# Patient Record
Sex: Female | Born: 1955 | Race: White | Hispanic: No | Marital: Married | Smoking: Former smoker
Health system: Southern US, Community
[De-identification: ages and names within clinical notes are randomized; demographics above are authoritative.]

## PROBLEM LIST (undated history)

## (undated) DIAGNOSIS — I10 Essential (primary) hypertension: Secondary | ICD-10-CM

## (undated) DIAGNOSIS — K219 Gastro-esophageal reflux disease without esophagitis: Secondary | ICD-10-CM

## (undated) DIAGNOSIS — E7800 Pure hypercholesterolemia, unspecified: Secondary | ICD-10-CM

## (undated) DIAGNOSIS — I499 Cardiac arrhythmia, unspecified: Secondary | ICD-10-CM

## (undated) DIAGNOSIS — F32A Depression, unspecified: Secondary | ICD-10-CM

## (undated) DIAGNOSIS — M1990 Unspecified osteoarthritis, unspecified site: Secondary | ICD-10-CM

## (undated) DIAGNOSIS — F329 Major depressive disorder, single episode, unspecified: Secondary | ICD-10-CM

## (undated) HISTORY — DX: Unspecified osteoarthritis, unspecified site: M19.90

## (undated) HISTORY — PX: OTHER SURGICAL HISTORY: SHX169

## (undated) HISTORY — DX: Essential (primary) hypertension: I10

## (undated) HISTORY — PX: PILONIDAL CYST EXCISION: SHX744

## (undated) HISTORY — PX: CHOLECYSTECTOMY OPEN: SUR202

## (undated) HISTORY — DX: Gastro-esophageal reflux disease without esophagitis: K21.9

## (undated) HISTORY — DX: Cardiac arrhythmia, unspecified: I49.9

## (undated) HISTORY — DX: Depression, unspecified: F32.A

## (undated) HISTORY — DX: Major depressive disorder, single episode, unspecified: F32.9

## (undated) HISTORY — DX: Pure hypercholesterolemia, unspecified: E78.00

---
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
## 1984-03-11 HISTORY — PX: TONSILLECTOMY: SUR1361

## 2004-11-27 ENCOUNTER — Ambulatory Visit: Payer: Self-pay | Admitting: Internal Medicine

## 2004-12-07 ENCOUNTER — Ambulatory Visit (HOSPITAL_COMMUNITY): Admission: RE | Admit: 2004-12-07 | Discharge: 2004-12-07 | Payer: Self-pay | Admitting: Internal Medicine

## 2004-12-07 ENCOUNTER — Ambulatory Visit: Payer: Self-pay | Admitting: Internal Medicine

## 2011-03-14 ENCOUNTER — Encounter (INDEPENDENT_AMBULATORY_CARE_PROVIDER_SITE_OTHER): Payer: Self-pay | Admitting: General Surgery

## 2011-03-15 ENCOUNTER — Ambulatory Visit (INDEPENDENT_AMBULATORY_CARE_PROVIDER_SITE_OTHER): Payer: 59 | Admitting: General Surgery

## 2011-03-15 ENCOUNTER — Encounter (INDEPENDENT_AMBULATORY_CARE_PROVIDER_SITE_OTHER): Payer: Self-pay | Admitting: General Surgery

## 2011-03-15 VITALS — BP 152/96 | HR 72 | Temp 97.4°F | Resp 16 | Ht 66.5 in | Wt 263.4 lb

## 2011-03-15 DIAGNOSIS — E66813 Obesity, class 3: Secondary | ICD-10-CM | POA: Insufficient documentation

## 2011-03-15 DIAGNOSIS — Z1231 Encounter for screening mammogram for malignant neoplasm of breast: Secondary | ICD-10-CM

## 2011-03-15 NOTE — Progress Notes (Signed)
Patient ID: Melanie Bray, female   DOB: January 24, 1956, 56 y.o.   MRN: [id number]  Chief Complaint  Patient presents with  . Other    new bariatric- RNY    HPI Melanie Bray is a 56 y.o. female.   HPI 56 year old morbidly obese Caucasian female referred by Dr Graciela Husbands for evaluation for weight loss surgery. The patient is specifically interested in gastric bypass surgery. She states that she struggled all her life with her weight. She's tried many different things for sustained weight loss however all have been unsuccessful. She has tried hypnosis, Weight Watchers, Nutrisystem, Doylene Bode, Slim fast [employer], and [employer] all without any long-term success.  Her comorbidities include a cardiac arrhythmia, hypertension, high cholesterol, borderline diabetes, gastroesophageal reflux disease, and depression, joint pain in her knees.  She is currently smoking. She smokes about 10-15 cigarettes a day. She has stopped smoking in the past.    Past Medical History  Diagnosis Date  . Hypertension   . Hypercholesteremia   . Depression   . GERD (gastroesophageal reflux disease)   . Arthritis   . Asthma   . Arrhythmia     Past Surgical History  Procedure Date  . Cholecystectomy open 1980s  . Tonsillectomy 1986  . Oophorectomy 1976  . Pilonidal cyst excision   . Tubal ligation     right tube    Family History  Problem Relation Age of Onset  . Cancer Mother     lung  . Heart disease Maternal Aunt   . Heart disease Maternal Uncle     Social History History  Substance Use Topics  . Smoking status: Current Everyday Smoker -- 0.7 packs/day  . Smokeless tobacco: Not on file  . Alcohol Use: No    No Known Allergies  Current Outpatient Prescriptions  Medication Sig Dispense Refill  . cetirizine (ZYRTEC) 10 MG tablet Take 10 mg by mouth daily.        . flecainide (TAMBOCOR) 100 MG tablet Take 100 mg by mouth 2 (two) times daily.        . metoprolol (LOPRESSOR) 100 MG  tablet Take 100 mg by mouth 2 (two) times daily.        . simvastatin (ZOCOR) 80 MG tablet Take 80 mg by mouth at bedtime.          Review of Systems Review of Systems  Constitutional: Negative for fever, activity change, appetite change and unexpected weight change.  HENT: Negative for hearing loss, ear pain, facial swelling and sneezing.   Eyes: Negative for photophobia, discharge and visual disturbance.  Respiratory: Negative for apnea, cough, chest tightness and shortness of breath.        +seasonal asthma; prn inhaler; +DOE with 1 flight. Denies PND, orthopnea, CP  Cardiovascular: Negative for chest pain, palpitations and leg swelling.       Takes med for heart arrhythmia. Has had a stress test and cardiac cath in the past. Her cardiologist is Dr Graciela Husbands in [place].  Gastrointestinal: Negative for abdominal pain, diarrhea, constipation, blood in stool and rectal pain.       +heartburn. Takes OTC reflux med  Genitourinary: Negative for dysuria, frequency, hematuria and vaginal bleeding.  Musculoskeletal: Negative for joint swelling.       L hand pain was told secondary to bulge disc in neck  Skin: Negative for color change and pallor.  Neurological: Negative for dizziness, tremors, syncope, light-headedness and headaches.  Hematological: Negative.   Psychiatric/Behavioral: Negative.  Blood pressure 152/96, pulse 72, temperature 97.4 F (36.3 C), temperature source Temporal, resp. rate 16, height 5' 6.5" (1.689 m), weight 263 lb 6.4 oz (119.477 kg).  Physical Exam Physical Exam  Vitals reviewed. Constitutional: She is oriented to person, place, and time. She appears well-developed and well-nourished. No distress.  HENT:  Head: Normocephalic and atraumatic.  Right Ear: External ear normal.  Left Ear: External ear normal.  Nose: Nose normal.  Eyes: Conjunctivae are normal. No scleral icterus.  Neck: Normal range of motion. Neck supple. No JVD present. No tracheal deviation  present. No thyromegaly present.  Cardiovascular: Normal rate, regular rhythm, normal heart sounds and intact distal pulses.   Pulmonary/Chest: Effort normal and breath sounds normal. No respiratory distress. She has no wheezes.  Abdominal: Soft. Bowel sounds are normal. She exhibits no distension. There is no tenderness.         Old subcostal and lower midline incision  Musculoskeletal: Normal range of motion. She exhibits no edema and no tenderness.  Lymphadenopathy:    She has no cervical adenopathy.  Neurological: She is alert and oriented to person, place, and time. She exhibits normal muscle tone.  Skin: Skin is warm and dry. No rash noted. She is not diaphoretic. No erythema.  Psychiatric: She has a normal mood and affect. Her behavior is normal. Judgment and thought content normal.    Data Reviewed Pt's personal letter & self reported diet history  Data Requested Dr Odessa Fleming notes including office note, stress test, and cardiac catherization results  Assessment    Morbid obesity BMI 42 Cardiac arrhythmia Hypertension Dyslipidemia GERD Joint Pain    Plan    We discussed laparoscopic Roux-en-Y gastric bypass. The patient was given educational material. I quoted the patient that they can expect to lose 50-70% of their excess weight with the gastric bypass. We did discuss the possibility of weight regain several years after the procedure.  I also explained to the patient that I would not perform a gastric bypass as long as she continues to smoke.  We discussed the increased risks of complications of surgery with smoking. We also discussed the increased incidence of stomal ulcers with smoking.   We discussed the risk and benefits of surgery including but not limited to anesthesia risk, bleeding, infection, blood clot formation, anastomotic leak, anastomotic stricture, ulcer formation, death, respiratory complications, intestinal blockage, internal hernia, vitamin and  nutritional deficiencies, injury to surrounding structures, failure to lose weight and mood changes.  We discussed that before and after surgery that there would be an alteration in their diet. I explained that we have put them on a diet 2 weeks before surgery. I also explained that they would be on a liquid diet for 2 weeks after surgery. We discussed that they would have to avoid certain foods such as sugar after surgery. We discussed the importance of physical activity as well as compliance with our dietary and supplement recommendations.  We will proceed with her evaluation for weight loss surgery. In the interim, she will increase her exercise activity and stop smoking.  We will check a urine nicotine level prior to submitting her claim to her insurance provider. She was informed of the impending urine nicotine test.  Mary Sella. Andrey Campanile, MD, FACS General, Bariatric, & Minimally Invasive Surgery [facility], [place]         [facility] M 03/15/2011, 11:58 AM

## 2011-03-15 NOTE — Patient Instructions (Addendum)
Stop smoking  We will start your weight loss surgery evaluation with labs, upper gi, chest xray, mammogram, cardiology clearance, nutrition and psychological evaluation.

## 2011-03-26 ENCOUNTER — Encounter: Payer: Self-pay | Admitting: *Deleted

## 2011-03-26 ENCOUNTER — Encounter: Payer: 59 | Attending: General Surgery | Admitting: *Deleted

## 2011-03-26 DIAGNOSIS — Z713 Dietary counseling and surveillance: Secondary | ICD-10-CM | POA: Insufficient documentation

## 2011-03-26 DIAGNOSIS — Z01818 Encounter for other preprocedural examination: Secondary | ICD-10-CM | POA: Insufficient documentation

## 2011-03-26 NOTE — Progress Notes (Signed)
  Pre-Op Assessment Visit: Pre-Operative Gastric Bypass Surgery  Medical Nutrition Therapy:  Appt start time: 1200 end time:  1300.  Patient was seen on 03/26/2011 for Pre-Operative Gastric Bypass Nutrition Assessment. Assessment and letter of approval faxed to [facility] Bariatric Surgery Program coordinator on 03/26/2011.  Approval letter sent to [facility] center and will be available in the chart under the media tab.  Handouts given during visit include:  Pre-Op Goals Handout  Patient to call for Pre-Op and Post-Op Nutrition Education at [facility] when surgery is scheduled.

## 2011-03-26 NOTE — Patient Instructions (Signed)
   Follow Pre-Op Nutrition Goals to prepare for Gastric Bypass Surgery.   Call the [facility] at [phone] once you have been given your surgery date to enrolled in the Pre-Op Nutrition Class. You will need to attend this nutrition class 3-4 weeks prior to your surgery. 

## 2011-04-02 ENCOUNTER — Encounter (HOSPITAL_COMMUNITY)
Admission: RE | Disposition: A | Discharge status: Home / Self Care | Payer: Self-pay | Source: Ambulatory Visit | Attending: General Surgery

## 2011-04-02 ENCOUNTER — Encounter (HOSPITAL_COMMUNITY): Payer: Self-pay

## 2011-04-02 ENCOUNTER — Ambulatory Visit (HOSPITAL_COMMUNITY)
Admission: RE | Admit: 2011-04-02 | Discharge: 2011-04-02 | Disposition: A | Discharge status: Home / Self Care | Payer: 59 | Source: Ambulatory Visit | Attending: General Surgery | Admitting: General Surgery

## 2011-04-02 DIAGNOSIS — Z01818 Encounter for other preprocedural examination: Secondary | ICD-10-CM | POA: Insufficient documentation

## 2011-04-02 HISTORY — PX: BREATH TEK H PYLORI: SHX5422

## 2011-04-02 SURGERY — BREATH TEST, FOR HELICOBACTER PYLORI

## 2011-04-03 ENCOUNTER — Encounter (HOSPITAL_COMMUNITY): Payer: Self-pay

## 2011-04-03 ENCOUNTER — Encounter (HOSPITAL_COMMUNITY): Payer: Self-pay | Admitting: General Surgery

## 2011-04-17 ENCOUNTER — Ambulatory Visit (HOSPITAL_COMMUNITY): Payer: Self-pay

## 2011-04-17 ENCOUNTER — Other Ambulatory Visit (HOSPITAL_COMMUNITY): Payer: Self-pay

## 2011-04-17 ENCOUNTER — Inpatient Hospital Stay (HOSPITAL_COMMUNITY): Admission: RE | Admit: 2011-04-17 | Payer: Self-pay | Source: Ambulatory Visit

## 2011-04-25 ENCOUNTER — Ambulatory Visit (HOSPITAL_COMMUNITY)
Admission: RE | Admit: 2011-04-25 | Discharge: 2011-04-25 | Disposition: A | Discharge status: Home / Self Care | Payer: 59 | Source: Ambulatory Visit | Attending: General Surgery | Admitting: General Surgery

## 2011-04-25 ENCOUNTER — Other Ambulatory Visit (INDEPENDENT_AMBULATORY_CARE_PROVIDER_SITE_OTHER): Payer: Self-pay | Admitting: General Surgery

## 2011-04-25 ENCOUNTER — Ambulatory Visit (HOSPITAL_COMMUNITY): Admission: RE | Admit: 2011-04-25 | Payer: 59 | Source: Ambulatory Visit

## 2011-04-25 DIAGNOSIS — K219 Gastro-esophageal reflux disease without esophagitis: Secondary | ICD-10-CM | POA: Insufficient documentation

## 2011-04-25 DIAGNOSIS — Z1231 Encounter for screening mammogram for malignant neoplasm of breast: Secondary | ICD-10-CM

## 2011-04-25 DIAGNOSIS — Z6841 Body Mass Index (BMI) 40.0 and over, adult: Secondary | ICD-10-CM | POA: Insufficient documentation

## 2011-04-25 DIAGNOSIS — E7800 Pure hypercholesterolemia, unspecified: Secondary | ICD-10-CM | POA: Insufficient documentation

## 2011-04-25 DIAGNOSIS — E66813 Obesity, class 3: Secondary | ICD-10-CM

## 2011-04-25 DIAGNOSIS — I10 Essential (primary) hypertension: Secondary | ICD-10-CM | POA: Insufficient documentation

## 2011-04-26 LAB — LIPID PANEL
Cholesterol: 136 mg/dL (ref 0–200)
HDL: 41 mg/dL (ref >39)
Total CHOL/HDL Ratio: 3.3 Ratio
VLDL: 19 mg/dL (ref 0–40)

## 2011-04-26 LAB — CBC WITH DIFFERENTIAL/PLATELET
Basophils Relative: 0 % (ref 0–1)
Eosinophils Absolute: 0.2 10*3/uL (ref 0.0–0.7)
Eosinophils Relative: 4 % (ref 0–5)
MCH: 29.9 pg (ref 26.0–34.0)
MCHC: 33.1 g/dL (ref 30.0–36.0)
MCV: 90.5 fL (ref 78.0–100.0)
Neutrophils Relative %: 50 % (ref 43–77)
Platelets: 260 10*3/uL (ref 150–400)
RDW: 13 % (ref 11.5–15.5)

## 2011-04-26 LAB — COMPREHENSIVE METABOLIC PANEL
ALT: 19 U/L (ref 0–35)
Alkaline Phosphatase: 94 U/L (ref 39–117)
Creat: 0.91 mg/dL (ref 0.50–1.10)
Sodium: 139 mEq/L (ref 135–145)
Total Bilirubin: 0.4 mg/dL (ref 0.3–1.2)
Total Protein: 7 g/dL (ref 6.0–8.3)

## 2011-04-26 LAB — TSH: TSH: 2.334 u[IU]/mL (ref 0.350–4.500)

## 2011-04-30 ENCOUNTER — Other Ambulatory Visit (INDEPENDENT_AMBULATORY_CARE_PROVIDER_SITE_OTHER): Payer: Self-pay | Admitting: General Surgery

## 2011-04-30 DIAGNOSIS — R928 Other abnormal and inconclusive findings on diagnostic imaging of breast: Secondary | ICD-10-CM

## 2011-05-03 ENCOUNTER — Encounter (INDEPENDENT_AMBULATORY_CARE_PROVIDER_SITE_OTHER): Payer: Self-pay | Admitting: General Surgery

## 2011-05-08 ENCOUNTER — Ambulatory Visit
Admission: RE | Admit: 2011-05-08 | Discharge: 2011-05-08 | Disposition: A | Discharge status: Home / Self Care | Payer: 59 | Source: Ambulatory Visit | Attending: General Surgery | Admitting: General Surgery

## 2011-05-08 DIAGNOSIS — R928 Other abnormal and inconclusive findings on diagnostic imaging of breast: Secondary | ICD-10-CM

## 2011-05-16 ENCOUNTER — Telehealth (INDEPENDENT_AMBULATORY_CARE_PROVIDER_SITE_OTHER): Payer: Self-pay | Admitting: General Surgery

## 2011-05-16 DIAGNOSIS — Z720 Tobacco use: Secondary | ICD-10-CM

## 2011-05-16 NOTE — Telephone Encounter (Addendum)
Called patient and left message on machine for her to call me back. Patient needs a urine nicotine ordered for evaluation prior to [place] performing bariatric surgery. Order placed. Awaiting return call from patient.

## 2011-05-16 NOTE — Telephone Encounter (Signed)
Message copied by Liliana Cline on Thu May 16, 2011  8:46 AM ------      Message from: Leandrew Koyanagi      Created: Wed May 15, 2011  4:20 PM       Lesly Rubenstein,            I sent this patient's information in for insurance approval on Friday. Please order the nicotine test that Dr Andrey Campanile wanted.            Thanks,            Okey Regal                  ----- Message -----         From: Liliana Cline, CMA         Sent: 04/10/2011  10:20 AM           To: Leandrew Koyanagi            I had made this reminder for myself, but I am not really going to know when everything is ready for this patient to send off for insurance approval, but she is supposed to stop smoking. If you will let me know when she is to that point I will order a urine nicotine on her. Thanks.             Brian Zeitlin            ----- Message -----         From: Liliana Cline, CMA         Sent: 03/15/2011  11:43 AM           To: Liliana Cline, CMA            Need urine nicotine prior to sending off for insurance approval

## 2011-05-17 ENCOUNTER — Telehealth (INDEPENDENT_AMBULATORY_CARE_PROVIDER_SITE_OTHER): Payer: Self-pay

## 2011-05-17 NOTE — Telephone Encounter (Signed)
Pt returned call to [facility]. Pt advised of need for urine test prior to surgery. Pt states she is working on stop smoking but still smokes some. Pt states she will to to lab once she has been off of cigarettes for a few days.

## 2011-05-24 ENCOUNTER — Telehealth (INDEPENDENT_AMBULATORY_CARE_PROVIDER_SITE_OTHER): Payer: Self-pay | Admitting: General Surgery

## 2011-05-24 ENCOUNTER — Other Ambulatory Visit (INDEPENDENT_AMBULATORY_CARE_PROVIDER_SITE_OTHER): Payer: Self-pay | Admitting: General Surgery

## 2011-05-24 NOTE — Telephone Encounter (Signed)
Pt calling in asking where to go for the urine nicotine test.  Please call her with details:  [phone]

## 2011-05-24 NOTE — Telephone Encounter (Signed)
Spoke with patient and made her aware of where to have done. She will go today to have urine nicotine done. Order faxed to both numbers for the lab.

## 2011-05-29 ENCOUNTER — Telehealth (INDEPENDENT_AMBULATORY_CARE_PROVIDER_SITE_OTHER): Payer: Self-pay | Admitting: General Surgery

## 2011-05-29 NOTE — Telephone Encounter (Signed)
Patient made aware nicotine test was negative. Melanie Bray will call her to get everything set up.

## 2011-06-20 ENCOUNTER — Encounter (INDEPENDENT_AMBULATORY_CARE_PROVIDER_SITE_OTHER): Payer: Self-pay | Admitting: General Surgery

## 2011-06-20 DIAGNOSIS — N6002 Solitary cyst of left breast: Secondary | ICD-10-CM | POA: Insufficient documentation

## 2011-06-27 ENCOUNTER — Encounter: Payer: 59 | Attending: General Surgery | Admitting: *Deleted

## 2011-06-27 DIAGNOSIS — Z713 Dietary counseling and surveillance: Secondary | ICD-10-CM | POA: Insufficient documentation

## 2011-06-27 DIAGNOSIS — Z01818 Encounter for other preprocedural examination: Secondary | ICD-10-CM | POA: Insufficient documentation

## 2011-06-30 NOTE — Patient Instructions (Signed)
Follow:   Pre-Op Diet per MD 2 weeks prior to surgery  Phase 2- Liquids (clear/full) 2 weeks after surgery  Vitamin/Mineral/Calcium guidelines for purchasing bariatric supplements  Exercise guidelines pre and post-op per MD  Follow-up at [facility] in 2 weeks post-op for diet advancement. Contact Brianny Soulliere as needed with questions/concerns. 

## 2011-06-30 NOTE — Progress Notes (Signed)
  Bariatric Class:  Appt start time: 0830 end time:  0930.  Pre-Operative Nutrition Class  Patient was seen on 06/27/2011 for Pre-Operative Bariatric Surgery Education at the [facility].  Surgery date: 07/29/11 Surgery type: RYGB  Last weight @ [facility]: 265.4 lbs (03/26/11)  Samples given per MNT protocol: Bariatric Advantage Multivitamin Lot # [id number] Exp: 09/13  Bariatric Advantage Calcium Citrate Lot # [id number] Exp: 09/13  Bariatric Advantage B-12 dots Lot # [id number] Exp: 05/13  Celebrate Vitamins Multivitamin Lot # [id number] Exp: 06/14  Celebrate Vitamins Calcium Citrate Lot # [id number] Exp: 07/13  Celebrate Vitamins B-12 dots Lot # [id number] Exp: 07/14  Corliss Marcus  Lot # [id number] Exp: 06/4  The following the learning objective met by the patient during this course:   Identifies Pre-Op Dietary Goals and will begin 2 weeks pre-operatively   Identifies appropriate sources of fluids and proteins   States protein recommendations and appropriate sources pre and post-operatively  Identifies Post-Operative Dietary Goals and will follow for 2 weeks post-operatively  Identifies appropriate multivitamin and calcium sources  Describes the need for physical activity post-operatively and will follow MD recommendations  States when to call healthcare provider regarding medication questions or post-operative complications  Handouts given during class include:  Pre-Op Bariatric Surgery Diet Handout  Protein Shake Handout  Post-Op Bariatric Surgery Nutrition Handout  BELT Program Information Flyer  Support Group Information Flyer  Follow-Up Plan: Patient will follow-up at [facility] 2 weeks post operatively for diet advancement per MD.

## 2011-07-12 ENCOUNTER — Encounter (INDEPENDENT_AMBULATORY_CARE_PROVIDER_SITE_OTHER): Payer: Self-pay | Admitting: General Surgery

## 2011-07-12 ENCOUNTER — Ambulatory Visit (INDEPENDENT_AMBULATORY_CARE_PROVIDER_SITE_OTHER): Payer: 59 | Admitting: General Surgery

## 2011-07-12 VITALS — BP 162/90 | HR 76 | Temp 98.1°F | Resp 16 | Ht 66.0 in | Wt 262.8 lb

## 2011-07-12 MED ORDER — PEG 3350-KCL-NABCB-NACL-NASULF 236 G PO SOLR: 4.0000 L | Freq: Once | ORAL | Status: AC

## 2011-07-12 NOTE — Progress Notes (Signed)
Patient ID: Melanie Bray, female   DOB: 1955/10/19, 56 y.o.   MRN: [id number]  Chief Complaint  Patient presents with  . Bariatric Pre-op    RNY    HPI Melanie Bray is a 56 y.o. female.   HPI [name] comes in to discuss her upcoming Roux-en-Y gastric bypass surgery. Today as her preoperative appointment. I initially saw her in January 2013. At her first appointment, she was smoking. Since that time she has stopped smoking. A urine nicotine test in March showed no evidence of nicotine. She has completed her preoperative workup and is scheduled for surgery. Her cardiologist has cleared her for surgery.  Past Medical History  Diagnosis Date  . Hypertension   . Hypercholesteremia   . Depression   . GERD (gastroesophageal reflux disease)   . Arthritis   . Asthma   . Arrhythmia     Past Surgical History  Procedure Date  . Cholecystectomy open 1980s  . Tonsillectomy 1986  . Oophorectomy 1976  . Pilonidal cyst excision   . Tubal ligation     right tube  . Breath tek h pylori 04/02/2011    Procedure: BREATH TEK H PYLORI;  Surgeon: Atilano Ina, MD;  Location: [facility];  Service: General;  Laterality: N/A;    Family History  Problem Relation Age of Onset  . Cancer Mother     lung  . Heart disease Maternal Aunt   . Heart disease Maternal Uncle     Social History History  Substance Use Topics  . Smoking status: Former Smoker -- 0.7 packs/day    Quit date: 05/10/2011  . Smokeless tobacco: Not on file  . Alcohol Use: No    No Known Allergies  Current Outpatient Prescriptions  Medication Sig Dispense Refill  . cetirizine (ZYRTEC) 10 MG tablet Take 10 mg by mouth daily.        . flecainide (TAMBOCOR) 100 MG tablet Take 100 mg by mouth 2 (two) times daily.        . metoprolol (LOPRESSOR) 100 MG tablet Take 100 mg by mouth 2 (two) times daily.        . simvastatin (ZOCOR) 80 MG tablet Take 80 mg by mouth at bedtime.        . polyethylene glycol  (GOLYTELY) 236 G solution Take 4,000 mLs by mouth once.  4000 mL  0    Review of Systems Review of Systems  Constitutional: Negative for chills, activity change, appetite change and unexpected weight change.  HENT: Negative for neck pain and ear discharge.   Eyes: Negative for photophobia and visual disturbance.  Respiratory: Negative for apnea, chest tightness and shortness of breath.        DOE with 1 flight; able to ambulate in store without SOB  Cardiovascular: Negative for chest pain.       Occasional fast heart beat. No cp, sob, orthopnea, PND, leg swelling  Gastrointestinal: Negative for abdominal pain and abdominal distention.       Some heartburn; takes OTC reflux prn  Genitourinary: Negative for dysuria and difficulty urinating.  Neurological: Negative for seizures, facial asymmetry, speech difficulty, numbness and headaches.  Hematological: Negative for adenopathy.  Psychiatric/Behavioral: Negative for behavioral problems.    Blood pressure 162/90, pulse 76, temperature 98.1 F (36.7 C), temperature source Temporal, resp. rate 16, height 5\' 6"  (1.676 m), weight 262 lb 12.8 oz (119.205 kg).  Physical Exam Physical Exam  Vitals reviewed. Constitutional: She is oriented to person, place,  and time. She appears well-developed and well-nourished. No distress.       obese  HENT:  Head: Normocephalic and atraumatic.  Right Ear: External ear normal.  Left Ear: External ear normal.  Eyes: Conjunctivae are normal. No scleral icterus.  Neck: Normal range of motion. Neck supple. No tracheal deviation present. No thyromegaly present.  Cardiovascular: Normal rate, regular rhythm, normal heart sounds and intact distal pulses.   No murmur heard. Pulmonary/Chest: Effort normal and breath sounds normal. No respiratory distress. She has no wheezes. She has no rales.  Abdominal: Soft. Bowel sounds are normal. She exhibits no distension. There is no tenderness.    Musculoskeletal: She  exhibits no edema and no tenderness.  Lymphadenopathy:    She has no cervical adenopathy.  Neurological: She is alert and oriented to person, place, and time. She exhibits normal muscle tone.  Skin: Skin is warm and dry. No rash noted. She is not diaphoretic. No erythema.  Psychiatric: She has a normal mood and affect. Her behavior is normal. Judgment and thought content normal.    Data Reviewed My last note UGI - small hiatal hernia Labs from 04/2011 Cardiac clearance note from Dr. Graciela Husbands in [place] on 06/12/11 - recommended telemetry, taking Toprol XL and tambocor on day of surgery  Assessment    Morbid obesity BMI 42.2 Hypertension Hypercholesterolemia Arthritis GERD - nonmedicated H/o SVT     Plan    We reviewed her preoperative evaluation including her upper GI, , labs, and clearance note from her cardiologist. I congratulated her on her smoking cessation. Her urine was tested for nicotine in March which was negative. We rediscussed the importance of smoking cessation for gastric bypass patients.  We rediscussed gastric bypass surgery and the typical postoperative course. We also briefly rediscussed the risks of surgery. I explained to her that her surgery may take a little bit longer than the typical patient surgery because of her prior abdominal surgery. She was encouraged to complete her bowel prep the day before surgery.  She is currently scheduled for laparoscopic Roux-en-Y gastric bypass with EGD on Monday, May 20.  She was encouraged to be compliant with her preoperative diet.  Mary Sella. Andrey Campanile, MD, FACS General, Bariatric, & Minimally Invasive Surgery [facility], [place]        [facility] M 07/12/2011, 4:12 PM

## 2011-07-16 ENCOUNTER — Encounter (HOSPITAL_COMMUNITY): Payer: Self-pay | Admitting: Pharmacy Technician

## 2011-07-23 NOTE — Pre-Procedure Instructions (Signed)
Need pre op orders pt coming for pre op 07-24-11 215pm. thanks

## 2011-07-24 ENCOUNTER — Telehealth (INDEPENDENT_AMBULATORY_CARE_PROVIDER_SITE_OTHER): Payer: Self-pay | Admitting: General Surgery

## 2011-07-24 ENCOUNTER — Encounter (HOSPITAL_COMMUNITY): Payer: Self-pay

## 2011-07-24 ENCOUNTER — Encounter (HOSPITAL_COMMUNITY)
Admission: RE | Admit: 2011-07-24 | Discharge: 2011-07-24 | Disposition: A | Discharge status: Home / Self Care | Payer: 59 | Source: Ambulatory Visit | Attending: General Surgery | Admitting: General Surgery

## 2011-07-24 LAB — CBC
HCT: 46.3 % — ABNORMAL HIGH (ref 36.0–46.0)
Hemoglobin: 15.6 g/dL — ABNORMAL HIGH (ref 12.0–15.0)
MCH: 29.8 pg (ref 26.0–34.0)
MCV: 88.5 fL (ref 78.0–100.0)
RBC: 5.23 MIL/uL — ABNORMAL HIGH (ref 3.87–5.11)

## 2011-07-24 LAB — COMPREHENSIVE METABOLIC PANEL
ALT: 24 U/L (ref 0–35)
BUN: 19 mg/dL (ref 6–23)
CO2: 27 mEq/L (ref 19–32)
Calcium: 9.4 mg/dL (ref 8.4–10.5)
GFR calc Af Amer: 89 mL/min — ABNORMAL LOW (ref >90)
GFR calc non Af Amer: 77 mL/min — ABNORMAL LOW (ref >90)
Glucose, Bld: 101 mg/dL — ABNORMAL HIGH (ref 70–99)
Sodium: 139 mEq/L (ref 135–145)

## 2011-07-24 NOTE — Pre-Procedure Instructions (Addendum)
Cardiac clearance note and ekg dr Graciela Husbands 06-12-2011 on chart Chest 2 view xray 04-25-2011 epic

## 2011-07-24 NOTE — Telephone Encounter (Signed)
Sharon from [facility] preop called. Need orders in [facility] for surgery on patient. They will order labs per anesthesia. Please put orders in system. Thanks.

## 2011-07-24 NOTE — Patient Instructions (Addendum)
20 ANGELYNA HENDERSON  07/24/2011   Your procedure is scheduled on:  07-29-2011 Monday  Report to [facility] at 0530  AM.  Call this number if you have problems the morning of surgery:[phone]   Remember:   Do not eat food or drink liquids:After Midnight.  .  Take these medicines the morning of surgery with A SIP OF WATER: zyrtec, flecaninide, metoprolol, simvastatin.   Do not wear jewelry or make up.  Do not wear lotions, powders, or perfumes.Do not wear deodorant.    Do not bring valuables to the hospital.  Contacts, dentures or bridgework may not be worn into surgery.  Leave suitcase in the car. After surgery it may be brought to your room.  For patients admitted to the hospital, checkout time is 11:00 AM the day of discharge.     Special Instructions: CHG Shower Use Special Wash: 1/2 bottle night before surgery and 1/2 bottle morning of surgery.neck down avoid private area, no shaving for 2 days before showers.   Please read over the following fact sheets that you were given: MRSA Information  [facility] pre op nurse phone number [phone], call if needed

## 2011-07-24 NOTE — Pre-Procedure Instructions (Addendum)
Spoke with sara glaspey rn, dr Andrey Campanile out of town until 07-29-2011 will make dr Andrey Campanile aware no orders in epic for 07-29-11 surgery, labs will be done per anesthesia with pst visit 07-24-2011.

## 2011-07-29 ENCOUNTER — Encounter (HOSPITAL_COMMUNITY): Payer: Self-pay | Admitting: Anesthesiology

## 2011-07-29 ENCOUNTER — Inpatient Hospital Stay (HOSPITAL_COMMUNITY)
Admission: RE | Admit: 2011-07-29 | Discharge: 2011-07-31 | DRG: 621 | Disposition: A | Discharge status: Home / Self Care | Payer: 59 | Source: Ambulatory Visit | Attending: General Surgery | Admitting: General Surgery

## 2011-07-29 ENCOUNTER — Ambulatory Visit (HOSPITAL_COMMUNITY): Payer: 59 | Admitting: Anesthesiology

## 2011-07-29 ENCOUNTER — Encounter (HOSPITAL_COMMUNITY)
Admission: RE | Disposition: A | Discharge status: Home / Self Care | Payer: Self-pay | Source: Ambulatory Visit | Attending: General Surgery

## 2011-07-29 ENCOUNTER — Encounter (HOSPITAL_COMMUNITY): Payer: Self-pay

## 2011-07-29 DIAGNOSIS — Z01812 Encounter for preprocedural laboratory examination: Secondary | ICD-10-CM

## 2011-07-29 DIAGNOSIS — K219 Gastro-esophageal reflux disease without esophagitis: Secondary | ICD-10-CM | POA: Diagnosis present

## 2011-07-29 DIAGNOSIS — I10 Essential (primary) hypertension: Secondary | ICD-10-CM

## 2011-07-29 DIAGNOSIS — Z6841 Body Mass Index (BMI) 40.0 and over, adult: Secondary | ICD-10-CM

## 2011-07-29 DIAGNOSIS — K210 Gastro-esophageal reflux disease with esophagitis: Secondary | ICD-10-CM

## 2011-07-29 DIAGNOSIS — M129 Arthropathy, unspecified: Secondary | ICD-10-CM | POA: Diagnosis present

## 2011-07-29 DIAGNOSIS — E66813 Obesity, class 3: Secondary | ICD-10-CM

## 2011-07-29 DIAGNOSIS — E7800 Pure hypercholesterolemia, unspecified: Secondary | ICD-10-CM | POA: Diagnosis present

## 2011-07-29 DIAGNOSIS — J45909 Unspecified asthma, uncomplicated: Secondary | ICD-10-CM | POA: Diagnosis present

## 2011-07-29 HISTORY — PX: GASTRIC ROUX-EN-Y: SHX5262

## 2011-07-29 LAB — TYPE AND SCREEN: ABO/RH(D): B POS

## 2011-07-29 SURGERY — LAPAROSCOPIC ROUX-EN-Y GASTRIC BYPASS WITH UPPER ENDOSCOPY
Anesthesia: General | Site: Abdomen | Wound class: Clean Contaminated

## 2011-07-29 MED ORDER — NEOSTIGMINE METHYLSULFATE 1 MG/ML IJ SOLN
INTRAMUSCULAR | Status: DC | PRN
  Administered 2011-07-29: 4 mg via INTRAVENOUS

## 2011-07-29 MED ORDER — UNJURY CHICKEN SOUP POWDER
2.0000 [oz_av] | Freq: Four times a day (QID) | ORAL | Status: DC
  Filled 2011-07-29 (×4): qty 27

## 2011-07-29 MED ORDER — BUPIVACAINE-EPINEPHRINE 0.25% -1:200000 IJ SOLN
INTRAMUSCULAR | Status: AC
  Filled 2011-07-29: qty 1

## 2011-07-29 MED ORDER — 0.9 % SODIUM CHLORIDE (POUR BTL) OPTIME
TOPICAL | Status: DC | PRN
  Administered 2011-07-29: 1000 mL

## 2011-07-29 MED ORDER — TISSEEL VH 10 ML EX KIT
PACK | CUTANEOUS | Status: DC | PRN
  Administered 2011-07-29: 10 mL

## 2011-07-29 MED ORDER — CIPROFLOXACIN IN D5W 400 MG/200ML IV SOLN
INTRAVENOUS | Status: DC | PRN
  Administered 2011-07-29: 400 mg via INTRAVENOUS

## 2011-07-29 MED ORDER — UNJURY VANILLA POWDER
2.0000 [oz_av] | Freq: Four times a day (QID) | ORAL | Status: DC
  Filled 2011-07-29 (×4): qty 27

## 2011-07-29 MED ORDER — LACTATED RINGERS IV SOLN
INTRAVENOUS | Status: DC | PRN
  Administered 2011-07-29 (×3): via INTRAVENOUS

## 2011-07-29 MED ORDER — ONDANSETRON HCL 4 MG/2ML IJ SOLN
4.0000 mg | INTRAMUSCULAR | Status: DC | PRN
  Administered 2011-07-29: 4 mg via INTRAVENOUS
  Filled 2011-07-29: qty 2

## 2011-07-29 MED ORDER — PROPOFOL 10 MG/ML IV EMUL
INTRAVENOUS | Status: DC | PRN
  Administered 2011-07-29: 200 mg via INTRAVENOUS

## 2011-07-29 MED ORDER — ACETAMINOPHEN 10 MG/ML IV SOLN
1000.0000 mg | Freq: Four times a day (QID) | INTRAVENOUS | Status: AC
  Administered 2011-07-29 – 2011-07-30 (×4): 1000 mg via INTRAVENOUS
  Filled 2011-07-29 (×4): qty 100

## 2011-07-29 MED ORDER — KCL IN DEXTROSE-NACL 20-5-0.45 MEQ/L-%-% IV SOLN
INTRAVENOUS | Status: AC
  Filled 2011-07-29: qty 1000

## 2011-07-29 MED ORDER — STERILE WATER FOR IRRIGATION IR SOLN
Status: DC | PRN
  Administered 2011-07-29: 1500 mL

## 2011-07-29 MED ORDER — ACETAMINOPHEN 10 MG/ML IV SOLN
INTRAVENOUS | Status: AC
  Filled 2011-07-29: qty 100

## 2011-07-29 MED ORDER — FIBRIN SEALANT COMPONENT 5 ML EX KIT
PACK | CUTANEOUS | Status: AC
  Filled 2011-07-29: qty 4

## 2011-07-29 MED ORDER — BUPIVACAINE-EPINEPHRINE 0.25% -1:200000 IJ SOLN
INTRAMUSCULAR | Status: DC | PRN
  Administered 2011-07-29: 40 mL

## 2011-07-29 MED ORDER — MIDAZOLAM HCL 5 MG/5ML IJ SOLN
INTRAMUSCULAR | Status: DC | PRN
  Administered 2011-07-29: 2 mg via INTRAVENOUS

## 2011-07-29 MED ORDER — ACETAMINOPHEN 10 MG/ML IV SOLN
INTRAVENOUS | Status: DC | PRN
  Administered 2011-07-29: 1000 mg via INTRAVENOUS

## 2011-07-29 MED ORDER — HEPARIN SODIUM (PORCINE) 5000 UNIT/ML IJ SOLN
5000.0000 [IU] | Freq: Once | INTRAMUSCULAR | Status: AC
  Administered 2011-07-29: 5000 [IU] via SUBCUTANEOUS

## 2011-07-29 MED ORDER — HEPARIN SODIUM (PORCINE) 5000 UNIT/ML IJ SOLN
INTRAMUSCULAR | Status: AC
  Filled 2011-07-29: qty 1

## 2011-07-29 MED ORDER — ONDANSETRON HCL 4 MG/2ML IJ SOLN
INTRAMUSCULAR | Status: AC
  Filled 2011-07-29: qty 2

## 2011-07-29 MED ORDER — UNJURY CHOCOLATE CLASSIC POWDER
2.0000 [oz_av] | Freq: Four times a day (QID) | ORAL | Status: DC
  Filled 2011-07-29 (×4): qty 27

## 2011-07-29 MED ORDER — HYDROMORPHONE HCL PF 1 MG/ML IJ SOLN: 0.2500 mg | INTRAMUSCULAR | Status: DC | PRN

## 2011-07-29 MED ORDER — KCL IN DEXTROSE-NACL 20-5-0.45 MEQ/L-%-% IV SOLN
INTRAVENOUS | Status: DC
  Administered 2011-07-29 – 2011-07-30 (×2): via INTRAVENOUS
  Administered 2011-07-30: 125 mL/h via INTRAVENOUS
  Administered 2011-07-30: 1000 mL via INTRAVENOUS
  Filled 2011-07-29 (×9): qty 1000

## 2011-07-29 MED ORDER — CIPROFLOXACIN IN D5W 400 MG/200ML IV SOLN
INTRAVENOUS | Status: AC
  Filled 2011-07-29: qty 200

## 2011-07-29 MED ORDER — HEPARIN SODIUM (PORCINE) 5000 UNIT/ML IJ SOLN
5000.0000 [IU] | Freq: Three times a day (TID) | INTRAMUSCULAR | Status: DC
  Administered 2011-07-29 – 2011-07-31 (×5): 5000 [IU] via SUBCUTANEOUS
  Filled 2011-07-29 (×9): qty 1

## 2011-07-29 MED ORDER — LIDOCAINE HCL (CARDIAC) 20 MG/ML IV SOLN
INTRAVENOUS | Status: DC | PRN
  Administered 2011-07-29: 80 mg via INTRAVENOUS

## 2011-07-29 MED ORDER — SUCCINYLCHOLINE CHLORIDE 20 MG/ML IJ SOLN
INTRAMUSCULAR | Status: DC | PRN
  Administered 2011-07-29: 100 mg via INTRAVENOUS

## 2011-07-29 MED ORDER — METOPROLOL TARTRATE 1 MG/ML IV SOLN: 5.0000 mg | Freq: Four times a day (QID) | INTRAVENOUS | Status: DC | PRN

## 2011-07-29 MED ORDER — MORPHINE SULFATE 2 MG/ML IJ SOLN
2.0000 mg | INTRAMUSCULAR | Status: DC | PRN
  Administered 2011-07-29 – 2011-07-30 (×8): 2 mg via INTRAVENOUS
  Filled 2011-07-29 (×8): qty 1

## 2011-07-29 MED ORDER — ROCURONIUM BROMIDE 100 MG/10ML IV SOLN
INTRAVENOUS | Status: DC | PRN
  Administered 2011-07-29: 10 mg via INTRAVENOUS
  Administered 2011-07-29: 50 mg via INTRAVENOUS
  Administered 2011-07-29: 10 mg via INTRAVENOUS

## 2011-07-29 MED ORDER — GLYCOPYRROLATE 0.2 MG/ML IJ SOLN
INTRAMUSCULAR | Status: DC | PRN
  Administered 2011-07-29: 0.6 mg via INTRAVENOUS

## 2011-07-29 MED ORDER — OXYCODONE-ACETAMINOPHEN 5-325 MG/5ML PO SOLN
5.0000 mL | ORAL | Status: DC | PRN
  Administered 2011-07-30 – 2011-07-31 (×2): 10 mL via ORAL
  Administered 2011-07-31 (×2): 5 mL via ORAL
  Filled 2011-07-29: qty 10
  Filled 2011-07-29: qty 5
  Filled 2011-07-29: qty 10
  Filled 2011-07-29 (×2): qty 5

## 2011-07-29 MED ORDER — ONDANSETRON HCL 4 MG/2ML IJ SOLN
INTRAMUSCULAR | Status: DC | PRN
  Administered 2011-07-29: 4 mg via INTRAVENOUS

## 2011-07-29 MED ORDER — ACETAMINOPHEN 160 MG/5ML PO SOLN: 650.0000 mg | ORAL | Status: DC | PRN

## 2011-07-29 MED ORDER — LACTATED RINGERS IR SOLN
Status: DC | PRN
  Administered 2011-07-29: 3000 mL

## 2011-07-29 MED ORDER — FENTANYL CITRATE 0.05 MG/ML IJ SOLN
INTRAMUSCULAR | Status: DC | PRN
  Administered 2011-07-29: 150 ug via INTRAVENOUS
  Administered 2011-07-29: 50 ug via INTRAVENOUS
  Administered 2011-07-29 (×3): 100 ug via INTRAVENOUS

## 2011-07-29 SURGICAL SUPPLY — 81 items
ADH SKN CLS APL DERMABOND .7 (GAUZE/BANDAGES/DRESSINGS)
APL SRG 32X5 SNPLK LF DISP (MISCELLANEOUS) ×1
APPLICATOR COTTON TIP 6IN STRL (MISCELLANEOUS) ×6 IMPLANT
APPLIER CLIP ROT 13.4 12 LRG (CLIP)
BAG SPEC RTRVL LRG 6X4 10 (ENDOMECHANICALS)
BENZOIN TINCTURE PRP APPL 2/3 (GAUZE/BANDAGES/DRESSINGS) IMPLANT
BLADE SURG 15 STRL LF DISP TIS (BLADE) IMPLANT
BLADE SURG 15 STRL SS (BLADE)
BLADE SURG SZ11 CARB STEEL (BLADE) ×3 IMPLANT
CABLE HIGH FREQUENCY MONO STRZ (ELECTRODE) ×3 IMPLANT
CANISTER SUCTION 2500CC (MISCELLANEOUS) ×3 IMPLANT
CLIP APPLIE ROT 13.4 12 LRG (CLIP) IMPLANT
CLIP SUT LAPRA TY ABSORB (SUTURE) ×6 IMPLANT
CLOTH BEACON ORANGE TIMEOUT ST (SAFETY) ×3 IMPLANT
COVER SURGICAL LIGHT HANDLE (MISCELLANEOUS) ×3 IMPLANT
CUTTER LINEAR ENDO ART 45 ETS (STAPLE) IMPLANT
DERMABOND ADVANCED (GAUZE/BANDAGES/DRESSINGS)
DERMABOND ADVANCED .7 DNX12 (GAUZE/BANDAGES/DRESSINGS) IMPLANT
DEVICE SUTURE ENDOST 10MM (ENDOMECHANICALS) ×3 IMPLANT
DISSECTOR BLUNT TIP ENDO 5MM (MISCELLANEOUS) IMPLANT
DRAIN PENROSE 18X1/4 LTX STRL (WOUND CARE) ×3 IMPLANT
DRAPE CAMERA CLOSED 9X96 (DRAPES) ×3 IMPLANT
DRAPE UTILITY XL STRL (DRAPES) ×3 IMPLANT
ELECT REM PT RETURN 9FT ADLT (ELECTROSURGICAL) ×3
ELECTRODE REM PT RTRN 9FT ADLT (ELECTROSURGICAL) ×2 IMPLANT
GAUZE SPONGE 4X4 16PLY XRAY LF (GAUZE/BANDAGES/DRESSINGS) ×3 IMPLANT
GLOVE BIO SURGEON STRL SZ7.5 (GLOVE) ×3 IMPLANT
GLOVE BIOGEL M STRL SZ7.5 (GLOVE) ×3 IMPLANT
GLOVE BIOGEL PI IND STRL 7.0 (GLOVE) ×2 IMPLANT
GLOVE BIOGEL PI INDICATOR 7.0 (GLOVE) ×1
GLOVE INDICATOR 8.0 STRL GRN (GLOVE) ×3 IMPLANT
GOWN STRL NON-REIN LRG LVL3 (GOWN DISPOSABLE) ×3 IMPLANT
GOWN STRL REIN XL XLG (GOWN DISPOSABLE) ×6 IMPLANT
HEMOSTAT SURGICEL 4X8 (HEMOSTASIS) IMPLANT
HOVERMATT SINGLE USE (MISCELLANEOUS) ×3 IMPLANT
KIT BASIN OR (CUSTOM PROCEDURE TRAY) ×3 IMPLANT
KIT GASTRIC LAVAGE 34FR ADT (SET/KITS/TRAYS/PACK) ×3 IMPLANT
NEEDLE SPNL 22GX3.5 QUINCKE BK (NEEDLE) ×3 IMPLANT
NS IRRIG 1000ML POUR BTL (IV SOLUTION) ×3 IMPLANT
PACK CARDIOVASCULAR III (CUSTOM PROCEDURE TRAY) ×3 IMPLANT
PEN SKIN MARKING BROAD (MISCELLANEOUS) ×3 IMPLANT
POUCH SPECIMEN RETRIEVAL 10MM (ENDOMECHANICALS) IMPLANT
RELOAD 45 VASCULAR/THIN (ENDOMECHANICALS) ×3 IMPLANT
RELOAD BLUE (STAPLE) ×9 IMPLANT
RELOAD ENDO STITCH 2.0 (ENDOMECHANICALS) ×17
RELOAD GOLD (STAPLE) ×3 IMPLANT
RELOAD STAPLE TA45 3.5 REG BLU (ENDOMECHANICALS) ×6 IMPLANT
RELOAD WHITE ECR60W (STAPLE) ×3 IMPLANT
SCALPEL HARMONIC ACE (MISCELLANEOUS) ×3 IMPLANT
SCISSORS LAP 5X35 DISP (ENDOMECHANICALS) ×3 IMPLANT
SEALANT SURGICAL APPL DUAL CAN (MISCELLANEOUS) ×3 IMPLANT
SET IRRIG TUBING LAPAROSCOPIC (IRRIGATION / IRRIGATOR) ×3 IMPLANT
SLEEVE ADV FIXATION 12X100MM (TROCAR) ×6 IMPLANT
SLEEVE ADV FIXATION 5X100MM (TROCAR) ×3 IMPLANT
SLEEVE ENDOPATH XCEL 5M (ENDOMECHANICALS) ×3 IMPLANT
SOLUTION ANTI FOG 6CC (MISCELLANEOUS) ×3 IMPLANT
SPONGE GAUZE 4X4 12PLY (GAUZE/BANDAGES/DRESSINGS) ×3 IMPLANT
STAPLE ECHEON FLEX 60 POW ENDO (STAPLE) ×3 IMPLANT
STAPLER VISISTAT 35W (STAPLE) ×3 IMPLANT
STRIP CLOSURE SKIN 1/2X4 (GAUZE/BANDAGES/DRESSINGS) IMPLANT
SUT ETHILON 3 0 PS 1 (SUTURE) IMPLANT
SUT MNCRL AB 4-0 PS2 18 (SUTURE) ×6 IMPLANT
SUT RELOAD ENDO STITCH 2 48X1 (ENDOMECHANICALS) ×10
SUT RELOAD ENDO STITCH 2.0 (ENDOMECHANICALS) ×8
SUT SILK 2 0 SH (SUTURE) IMPLANT
SUT VIC AB 2-0 SH 27 (SUTURE) ×3
SUT VIC AB 2-0 SH 27X BRD (SUTURE) ×2 IMPLANT
SUTURE RELOAD END STTCH 2 48X1 (ENDOMECHANICALS) ×10 IMPLANT
SUTURE RELOAD ENDO STITCH 2.0 (ENDOMECHANICALS) ×8 IMPLANT
SYR 20CC LL (SYRINGE) ×3 IMPLANT
SYR 50ML LL SCALE MARK (SYRINGE) ×3 IMPLANT
SYR CONTROL 10ML LL (SYRINGE) ×3 IMPLANT
TOWEL OR 17X26 10 PK STRL BLUE (TOWEL DISPOSABLE) ×3 IMPLANT
TRAY FOLEY CATH 14FRSI W/METER (CATHETERS) ×3 IMPLANT
TROCAR BALLN 12MMX100 BLUNT (TROCAR) ×3 IMPLANT
TROCAR BLADELESS OPT 5 100 (ENDOMECHANICALS) ×3 IMPLANT
TROCAR ENDOPATH XCEL 12X100 BL (ENDOMECHANICALS) ×9 IMPLANT
TROCAR XCEL 12X100 BLDLESS (ENDOMECHANICALS) ×3 IMPLANT
TUBING ENDO SMARTCAP (MISCELLANEOUS) ×3 IMPLANT
TUBING FILTER THERMOFLATOR (ELECTROSURGICAL) ×3 IMPLANT
WATER STERILE IRR 1500ML POUR (IV SOLUTION) ×3 IMPLANT

## 2011-07-29 NOTE — Anesthesia Preprocedure Evaluation (Signed)
Anesthesia Evaluation  Patient identified by MRN, date of birth, ID band Patient awake    Reviewed: Allergy & Precautions, H&P , NPO status , Patient's Chart, lab work & pertinent test results, reviewed documented beta blocker date and time   Airway Mallampati: II TM Distance: >3 FB Neck ROM: Full    Dental  (+) Upper Dentures and Lower Dentures   Pulmonary neg pulmonary ROS,  allergies breath sounds clear to auscultation        Cardiovascular + dysrhythmias Rhythm:Regular Rate:Normal  Hx palpitations, Lopressor Rx Denies cardiac sumptoms   Neuro/Psych negative neurological ROS  negative psych ROS   GI/Hepatic negative GI ROS, Neg liver ROS,   Endo/Other  Morbid obesity  Renal/GU negative Renal ROS  negative genitourinary   Musculoskeletal negative musculoskeletal ROS (+)   Abdominal   Peds negative pediatric ROS (+)  Hematology negative hematology ROS (+)   Anesthesia Other Findings   Reproductive/Obstetrics negative OB ROS                           Anesthesia Physical Anesthesia Plan  ASA: III  Anesthesia Plan: General   Post-op Pain Management:    Induction: Intravenous  Airway Management Planned: Oral ETT  Additional Equipment:   Intra-op Plan:   Post-operative Plan: Extubation in OR  Informed Consent: I have reviewed the patients History and Physical, chart, labs and discussed the procedure including the risks, benefits and alternatives for the proposed anesthesia with the patient or authorized representative who has indicated his/her understanding and acceptance.   Dental advisory given  Plan Discussed with: CRNA and Surgeon  Anesthesia Plan Comments:         Anesthesia Quick Evaluation

## 2011-07-29 NOTE — Progress Notes (Signed)
Dentures returned to patient, she placed in her mouth

## 2011-07-29 NOTE — Progress Notes (Signed)
CARE MANAGEMENT NOTE 07/29/2011  Patient:  Melanie Bray, Melanie Bray   Account Number:  [id number]  Date Initiated:  07/29/2011  Documentation initiated by:  Quantel Mcinturff  Subjective/Objective Assessment:   status post gastric bypass this am, history of arrhythmias recently stopped smoking, pre op wt 249     Action/Plan:   lives at home   Anticipated DC Date:  07/30/2011   Anticipated DC Plan:  HOME/SELF CARE  In-house referral  NA      DC Planning Services  NA      [place] Choice  NA   Choice offered to / List presented to:  NA   DME arranged  NA      DME agency  NA     HH arranged  NA      HH agency  NA   Status of service:  In process, will continue to follow Medicare Important Message given?  NA - LOS <3 / Initial given by admissions (If response is "NO", the following Medicare IM given date fields will be blank) Date Medicare IM given:   Date Additional Medicare IM given:    Discharge Disposition:    Per UR Regulation:  Reviewed for med. necessity/level of care/duration of stay  If discussed at Long Length of Stay Meetings, dates discussed:    Comments:  [id number]/Melanie Rish Earlene Plater, RN, BSN, CCM No discharge needs present at time of this review at the sdu/icu level. Case Management [phone]

## 2011-07-29 NOTE — H&P (View-Only) (Signed)
Patient ID: Melanie Bray, female   DOB: 09/19/1955, 55 y.o.   MRN: [id number]  Chief Complaint  Patient presents with  . Bariatric Pre-op    RNY    HPI Melanie Bray is a 55 y.o. female.   HPI 55-year-old Caucasian female comes in to discuss her upcoming Roux-en-Y gastric bypass surgery. Today as her preoperative appointment. I initially saw her in January 2013. At her first appointment, she was smoking. Since that time she has stopped smoking. A urine nicotine test in March showed no evidence of nicotine. She has completed her preoperative workup and is scheduled for surgery. Her cardiologist has cleared her for surgery.  Past Medical History  Diagnosis Date  . Hypertension   . Hypercholesteremia   . Depression   . GERD (gastroesophageal reflux disease)   . Arthritis   . Asthma   . Arrhythmia     Past Surgical History  Procedure Date  . Cholecystectomy open 1980s  . Tonsillectomy 1986  . Oophorectomy 1976  . Pilonidal cyst excision   . Tubal ligation     right tube  . Breath tek h pylori 04/02/2011    Procedure: BREATH TEK H PYLORI;  Surgeon: Melanie Canter M Shaquira Moroz, MD;  Location: [facility];  Service: General;  Laterality: N/A;    Family History  Problem Relation Age of Onset  . Cancer Mother     lung  . Heart disease Maternal Aunt   . Heart disease Maternal Uncle     Social History History  Substance Use Topics  . Smoking status: Former Smoker -- 0.7 packs/day    Quit date: 05/10/2011  . Smokeless tobacco: Not on file  . Alcohol Use: No    No Known Allergies  Current Outpatient Prescriptions  Medication Sig Dispense Refill  . cetirizine (ZYRTEC) 10 MG tablet Take 10 mg by mouth daily.        . flecainide (TAMBOCOR) 100 MG tablet Take 100 mg by mouth 2 (two) times daily.        . metoprolol (LOPRESSOR) 100 MG tablet Take 100 mg by mouth 2 (two) times daily.        . simvastatin (ZOCOR) 80 MG tablet Take 80 mg by mouth at bedtime.        . polyethylene glycol  (GOLYTELY) 236 G solution Take 4,000 mLs by mouth once.  4000 mL  0    Review of Systems Review of Systems  Constitutional: Negative for chills, activity change, appetite change and unexpected weight change.  HENT: Negative for neck pain and ear discharge.   Eyes: Negative for photophobia and visual disturbance.  Respiratory: Negative for apnea, chest tightness and shortness of breath.        DOE with 1 flight; able to ambulate in store without SOB  Cardiovascular: Negative for chest pain.       Occasional fast heart beat. No cp, sob, orthopnea, PND, leg swelling  Gastrointestinal: Negative for abdominal pain and abdominal distention.       Some heartburn; takes OTC reflux prn  Genitourinary: Negative for dysuria and difficulty urinating.  Neurological: Negative for seizures, facial asymmetry, speech difficulty, numbness and headaches.  Hematological: Negative for adenopathy.  Psychiatric/Behavioral: Negative for behavioral problems.    Blood pressure 162/90, pulse 76, temperature 98.1 F (36.7 C), temperature source Temporal, resp. rate 16, height 5' 6" (1.676 m), weight 262 lb 12.8 oz (119.205 kg).  Physical Exam Physical Exam  Vitals reviewed. Constitutional: She is oriented to person, place,   and time. She appears well-developed and well-nourished. No distress.       obese  HENT:  Head: Normocephalic and atraumatic.  Right Ear: External ear normal.  Left Ear: External ear normal.  Eyes: Conjunctivae are normal. No scleral icterus.  Neck: Normal range of motion. Neck supple. No tracheal deviation present. No thyromegaly present.  Cardiovascular: Normal rate, regular rhythm, normal heart sounds and intact distal pulses.   No murmur heard. Pulmonary/Chest: Effort normal and breath sounds normal. No respiratory distress. She has no wheezes. She has no rales.  Abdominal: Soft. Bowel sounds are normal. She exhibits no distension. There is no tenderness.    Musculoskeletal: She  exhibits no edema and no tenderness.  Lymphadenopathy:    She has no cervical adenopathy.  Neurological: She is alert and oriented to person, place, and time. She exhibits normal muscle tone.  Skin: Skin is warm and dry. No rash noted. She is not diaphoretic. No erythema.  Psychiatric: She has a normal mood and affect. Her behavior is normal. Judgment and thought content normal.    Data Reviewed My last note UGI - small hiatal hernia Labs from 04/2011 Cardiac clearance note from Dr. Chauhan in [place] on 06/12/11 - recommended telemetry, taking Toprol XL and tambocor on day of surgery  Assessment    Morbid obesity BMI 42.2 Hypertension Hypercholesterolemia Arthritis GERD - nonmedicated H/o SVT     Plan    We reviewed her preoperative evaluation including her upper GI, , labs, and clearance note from her cardiologist. I congratulated her on her smoking cessation. Her urine was tested for nicotine in March which was negative. We rediscussed the importance of smoking cessation for gastric bypass patients.  We rediscussed gastric bypass surgery and the typical postoperative course. We also briefly rediscussed the risks of surgery. I explained to her that her surgery may take a little bit longer than the typical patient surgery because of her prior abdominal surgery. She was encouraged to complete her bowel prep the day before surgery.  She is currently scheduled for laparoscopic Roux-en-Y gastric bypass with EGD on Monday, May 20.  She was encouraged to be compliant with her preoperative diet.  [name] M. Yuri Flener, MD, FACS General, Bariatric, & Minimally Invasive Surgery [facility]        [name] M 07/12/2011, 4:12 PM    

## 2011-07-29 NOTE — Op Note (Signed)
Upper GI endoscopy is performed at the completion of laparoscopic Roux-en-Y gastric bypass by Dr. Andrey Campanile. The Olympus video endoscope was inserted into the upper esophagus and then passed under direct vision to the EG junction. The small gastric pouch was insufflated with air while the gastric outlet was clamped under irrigation by the operating surgeon. There was no evidence of leak. The anastomosis was visualized and was patent. Suture and staple lines were intact and without bleeding. The pouch was tubular and measured 4-5 cm in length. At the completion of the procedure the pouch was desufflated and the scope withdrawn  Mariella Saa MD, FACS  07/29/2011, 2:54 PM .

## 2011-07-29 NOTE — Anesthesia Postprocedure Evaluation (Signed)
  Anesthesia Post-op Note  Patient: Melanie Bray  Procedure(s) Performed: Procedure(s) (LRB): LAPAROSCOPIC ROUX-EN-Y GASTRIC BYPASS WITH UPPER ENDOSCOPY ()  Patient Location: PACU  Anesthesia Type: General  Level of Consciousness: oriented and sedated  Airway and Oxygen Therapy: Patient Spontanous Breathing and Patient connected to nasal cannula oxygen  Post-op Pain: mild  Post-op Assessment: Post-op Vital signs reviewed, Patient's Cardiovascular Status Stable, Respiratory Function Stable and Patent Airway  Post-op Vital Signs: stable  Complications: No apparent anesthesia complications

## 2011-07-29 NOTE — Interval H&P Note (Signed)
History and Physical Interval Note:  07/29/2011 7:21 AM  Melanie Bray  has presented today for surgery, with the diagnosis of MORBID OBESITY   The various methods of treatment have been discussed with the patient and family. After consideration of risks, benefits and other options for treatment, the patient has consented to  Procedure(s) (LRB): LAPAROSCOPIC ROUX-EN-Y GASTRIC (N/A) Possible HERNIA REPAIR HIATAL (N/A); EGD as a surgical intervention .  The patients' history has been reviewed, patient examined, no change in status, stable for surgery.  I have reviewed the patients' chart and labs.  Questions were answered to the patient's satisfaction.    Pt was successful with her preop diet. Her weight is now 249 from 262. Otherwise no changes.   Mary Sella. Andrey Campanile, MD, FACS General, Bariatric, & Minimally Invasive Surgery [facility], [place]   [facility] M

## 2011-07-29 NOTE — Op Note (Signed)
Preop diagnosis:  1. Morbid obesity (BMI 42)  2. Hypertension 3. Hypercholesterolemia 4. OA 5. GERD  Postop diagnosis:  1. same  Surgical procedure: Laparoscopic Roux-en-Y gastric bypass; EGD  Surgeon: Atilano Ina, M.D. FACS  Asst.: Glenna Fellows, MD FACS  Anesthesia: General plus 41 cc 0.25% marcaine with epi  Complications: None   EBL: Minimal   Drains: None   Disposition: PACU in good condition   Indications for procedure: 56yo WF with morbid obesity who has been unsuccessful at sustained weight loss. The patient's comorbidities are listed above. We discussed the risk and benefits of surgery including but not limited to anesthesia risk, bleeding, infection, blood clot formation, anastomotic leak, anastomotic stricture, ulcer formation, death, respiratory complications, intestinal blockage, internal hernia, gallstone formation, vitamin and nutritional deficiencies, injury to surrounding structures, failure to lose weight and mood changes.   Description of procedure: Patient is brought to the operating room and general anesthesia induced. The patient had received preoperative broad-spectrum IV antibiotics and subcutaneous heparin. The abdomen was widely sterilely prepped with Chloraprep and draped. Patient timeout was performed and correct patient and procedure confirmed. Access was obtained with a 12 mm Optiview trocar in the left upper quadrant and pneumoperitoneum established without difficulty. The patient has omental adhesions in her right upper quadrant from her prior open cholecystectomy. A 12 mm trocar was placed under direct vision just to the left of the umbilicus. I then lysed these filmy omental adhesions for 10 minutes freeing the right upper quadrant. Under direct vision 12 mm trocars were placed laterally in the right upper quadrant, right upper quadrant midclavicular line. A 5 mm trocar was placed laterally in the left upper quadrant. The omentum was brought into  the upper abdomen and the transverse mesocolon elevated and the ligament of Treitz clearly identified. A 40 cm biliopancreatic limb was then carefully measured from the ligament of Treitz. The small intestine was divided at this point with a single firing of the white load linear stapler. A Penrose drain was sutured to the end of the Roux-en-Y limb for later identification. A 100 cm Roux-en-Y limb was then carefully measured. At this point a side-to-side anastomosis was created between the Roux limb and the end of the biliopancreatic limb. This was accomplished with a single firing of the 45 mm white load linear stapler. The common enterotomy was closed with a running 2-0 Vicryl begun at either end of the enterotomy and tied centrally. The mesenteric defect was then closed with running 2-0 silk. The omentum was then divided with the harmonic scalpel up towards the transverse colon to allow mobility of the Roux limb toward the gastric pouch. The patient was then placed in steep reversed Trendelenburg. Through a 5 mm subxiphoid site the [facility] retractor was placed and the left lobe of the liver elevated with excellent exposure of the upper stomach and hiatus. The angle of Hiss was then mobilized with the harmonic scalpel. A 4 cm gastric pouch was then carefully measured along the lesser curve of the stomach. Dissection was carried along the lesser curve at this point with the Harmonic scalpel working carefully back toward the lesser sac at right angles to the lesser curve. The free lesser sac was then entered. After being sure all tubes were removed from the stomach an initial firing of the gold load 60 mm linear stapler was fired at right angles across the lesser curve for about 4 cm. The gastric pouch was further mobilized posteriorly and then the pouch was  completed with 3 further firings of the 60 mm blue load linear stapler and 1 final firing of a blue load 45mm linear stapler up through the previously  dissected angle of His. It was ensured that the pouch was completely mobilized away from the gastric remnant. This created a nice tubular 4-5 cm gastric pouch.  The Roux limb was then brought up in an antecolic fashion with the candycane facing to the patient's left without undue tension. The gastrojejunostomy was created with an initial posterior row of 2-0 Vicryl between the Roux limb and the staple line of the gastric pouch. Enterotomies were then made in the gastric pouch and the Roux limb with the harmonic scalpel and at approximately 2-2-1/2 cm anastomosis was created with a single firing of the 45mm blue load linear stapler. The staple line was inspected and was intact without bleeding. The common enterotomy was then closed with running 2-0 Vicryl begun at either end and tied centrally. The Ewall tube was then easily passed through the anastomosis and an outer anterior layer of running 2-0 Vicryl was placed. The Ewald tube was removed. The penrose drain was removed. With the outlet of the gastrojejunostomy clamped and under saline irrigation the assistant performed upper endoscopy and with the gastric pouch tensely distended with air-there was no evidence of leak on this test. The pouch was desufflated. The Vonita Moss defect was closed with running 2-0 silk. The abdomen was inspected for any evidence of bleeding or bowel injury and everything looked fine. The Nathanson retractor was removed under direct vision after coating the anastomosis with Tisseel tissue sealant. All CO2 was evacuated and trochars removed. Skin incisions were closed with 4-0 monocryl in a subcuticular fashion then benzoin, steri-strips, and bandages were applied. Sponge needle and instrument counts were correct. The patient was taken to the PACU in good condition.    Melanie Bray. Andrey Campanile, MD, FACS General, Bariatric, & Minimally Invasive Surgery [facility], [place]

## 2011-07-29 NOTE — Transfer of Care (Signed)
Immediate Anesthesia Transfer of Care Note  Patient: Melanie Bray  Procedure(s) Performed: Procedure(s) (LRB): LAPAROSCOPIC ROUX-EN-Y GASTRIC BYPASS WITH UPPER ENDOSCOPY ()  Patient Location: PACU  Anesthesia Type: General  Level of Consciousness: awake and alert   Airway & Oxygen Therapy: Patient Spontanous Breathing and Patient connected to face mask oxygen  Post-op Assessment: Report given to PACU RN and Post -op Vital signs reviewed and stable  Post vital signs: Reviewed and stable  Complications: No apparent anesthesia complications

## 2011-07-30 ENCOUNTER — Encounter (HOSPITAL_COMMUNITY): Payer: Self-pay | Admitting: General Surgery

## 2011-07-30 ENCOUNTER — Inpatient Hospital Stay (HOSPITAL_COMMUNITY): Payer: 59

## 2011-07-30 LAB — DIFFERENTIAL
Basophils Absolute: 0 10*3/uL (ref 0.0–0.1)
Lymphocytes Relative: 9 % — ABNORMAL LOW (ref 12–46)
Monocytes Absolute: 1.1 10*3/uL — ABNORMAL HIGH (ref 0.1–1.0)
Neutro Abs: 9.9 10*3/uL — ABNORMAL HIGH (ref 1.7–7.7)

## 2011-07-30 LAB — CBC
HCT: 36.3 % (ref 36.0–46.0)
RDW: 12.8 % (ref 11.5–15.5)
WBC: 12 10*3/uL — ABNORMAL HIGH (ref 4.0–10.5)

## 2011-07-30 LAB — HEMOGLOBIN AND HEMATOCRIT, BLOOD: Hemoglobin: 10.9 g/dL — ABNORMAL LOW (ref 12.0–15.0)

## 2011-07-30 MED ORDER — METOPROLOL TARTRATE 100 MG PO TABS
100.0000 mg | ORAL_TABLET | Freq: Two times a day (BID) | ORAL | Status: DC
  Administered 2011-07-30 – 2011-07-31 (×2): 100 mg via ORAL
  Filled 2011-07-30 (×4): qty 1

## 2011-07-30 MED ORDER — BIOTENE DRY MOUTH MT LIQD
15.0000 mL | Freq: Two times a day (BID) | OROMUCOSAL | Status: DC
  Administered 2011-07-30 (×2): 15 mL via OROMUCOSAL

## 2011-07-30 MED ORDER — CHLORHEXIDINE GLUCONATE 0.12 % MT SOLN
15.0000 mL | Freq: Two times a day (BID) | OROMUCOSAL | Status: DC
  Administered 2011-07-30 – 2011-07-31 (×3): 15 mL via OROMUCOSAL
  Filled 2011-07-30 (×5): qty 15

## 2011-07-30 MED ORDER — IOHEXOL 300 MG/ML  SOLN
50.0000 mL | Freq: Once | INTRAMUSCULAR | Status: AC | PRN
  Administered 2011-07-30: 50 mL via ORAL

## 2011-07-30 MED ORDER — FLECAINIDE ACETATE 100 MG PO TABS
100.0000 mg | ORAL_TABLET | Freq: Two times a day (BID) | ORAL | Status: DC
  Administered 2011-07-30 – 2011-07-31 (×2): 100 mg via ORAL
  Filled 2011-07-30 (×4): qty 1

## 2011-07-30 MED ORDER — PNEUMOCOCCAL VAC POLYVALENT 25 MCG/0.5ML IJ INJ
0.5000 mL | INJECTION | Freq: Once | INTRAMUSCULAR | Status: AC
  Administered 2011-07-30: 0.5 mL via INTRAMUSCULAR
  Filled 2011-07-30: qty 0.5

## 2011-07-30 NOTE — Progress Notes (Signed)
Nurse unable to take pt in #[id number], Pt will now be transferred to 1415. Per Diplomatic Services operational officer she will call back to get report.

## 2011-07-30 NOTE — Progress Notes (Signed)
Pt just returned from ambulating in hallways; alert and oriented; VSS; denies any nausea or vomiting today; UGI result okay and Dr. Andrey Campanile had already communicated that with [facility], RN and orders to advance to ice chips; +flatus; no BM yet; voiding without difficulty; c/o abdominal soreness with relief from prn pain meds; discharge instructions given for pt to review; pt already has follow up appts with [facility] and [facility]; pt aware of BELT program and support group. GASTRIC BYPASS/SLEEVE DISCHARGE INSTRUCTIONS  Drs. Fredrik Rigger, [place], [place], and [place] Call if you have any problems.   Call [phone] and ask for the surgeon on call.    If you need immediate assistance come to the ER at [facility] Tell the ER personnel that you are a new post-op gastric bypass patient. Signs and symptoms to report:   Severe vomiting or nausea. If you cannot tolerate clear liquids for longer than 1 day, you need to call your surgeon.    Abdominal pain which does not get better after taking your pain medication   Fever greater than 101 F degree   Difficulty breathing   Chest pain    Redness, swelling, drainage, or foul odor at incision sites    If your incisions open or pull apart   Swelling or pain in calf (lower leg)   Diarrhea, frequent watery, uncontrolled bowel movements.   Constipation, (no bowel movements for 3 days) if this occurs, Take Milk of Magnesia, 2 tablespoons by mouth, 3 times a day for 2 days if needed.  Call your doctor if constipation continues. Stop taking Milk of Magnesia once you have had a bowel movement. You may also use Miralax according to the label instructions.   Anything you consider "abnormal for you".   Normal side effects after Surgery:   Unable to sleep at night or concentrate   Irritability   Being tearful (crying) or depressed   These are common complaints, possibly related to your anesthesia, stress of surgery and change in lifestyle, that usually go away a few weeks  after surgery.  If these feelings continue, call your medical doctor.  Wound Care You may have surgical glue, steri-strips, or staples over your incisions after surgery.  Surgical glue:  Looks like a clear film over your incisions and will wear off gradually. Steri-strips: Strips of tape over your incisions. You may notice a yellowish color on the skin underneath the steri-strips. This is a substance used to make the steri-strips stick better. Do not pull the steri-strips off - let them fall off.  Staples: Cherlynn Polo may be removed before you leave the hospital. If you go home with staples, call [facility] [phone]) for an appointment with your surgeon's nurse to have staples removed in 7 - 10 days. Showering: You may shower two days after your surgery unless otherwise instructed by your surgeon. Wash gently around wounds with warm soapy water, rinse well, and gently pat dry.  If you have a drain, you may need someone to hold this while you shower. Avoid tub baths until staples are removed and incisions are healed.    Medications   Medications should be liquid or crushed if larger than the size of a dime.  Extended release pills should not be crushed.   Depending on the size and number of medications you take, you may need to stagger/change the time you take your medications so that you do not over-fill your pouch.    Make sure you follow-up with your primary care  physician to make medication adjustments needed during rapid weight loss and life-style adjustment.   If you are diabetic, follow up with the doctor that prescribes your diabetes medication(s) within one week after surgery and check your blood sugar regularly.   Do not drive while taking narcotics!   Do not take acetaminophen (Tylenol) and Roxicet or Lortab Elixir at the same time since these pain medications contain acetaminophen.  Diet at home: (First 2 Weeks) You will see the nutritionist two weeks after your surgery.  She will advance your diet if you are tolerating liquids well. Once at home, if you have severe vomiting or nausea and cannot tolerate clear liquids lasting longer than 1 day, call your surgeon.  Begin high protein shake 2 ounces every 3 hours, 5 - 6 times per day.  Gradually increase the amount you drink as tolerated.  You may find it easier to slowly sip shakes throughout the day.  It is important to get your proteins in first.   Protein Shake   Drink at least 2 ounces of shake 5-6 times per day   Each serving of protein shakes should have a minimum of 15 grams of protein and no more than 5 grams of carbohydrate    Increase the amount of protein shake you drink as tolerated   Protein powder may be added to fluids such as non-fat milk or Lactaid milk (limit to 20 grams added protein powder per serving   The initial goal is to drink at least 8 ounces of protein shake/drink per day (or as directed by the nutritionist). Some examples of protein shakes are [employer], [employer], EAS Edge HP, and Unjury. Hydration   Gradually increase the amount of water and other liquids as tolerated (See Acceptable Fluids)   Gradually increase the amount of protein shake as tolerated     Sip fluids slowly and throughout the day   May use Sugar substitutes, use sparingly (limit to 6 - 8 packets per day). Your fluid goal is 64 ounces of fluid daily. It may take a few weeks to build up to this.         32 oz (or more) should be clear liquids and 32 oz (or more) should be full liquids.         Liquids should not contain sugar, caffeine, or carbonation! Acceptable Fluids Clear Liquids:   Water or Sugar-free flavored water, Fruit H2O   Decaffeinated coffee or tea (sugar-free)   Crystal Lite, Wyler's Lite, Minute Maid Lite   Sugar-free Jell-O   Bouillon or broth   Sugar-free Popsicle:   *Less than 20 calories each; Limit 1 per day   Full Liquids:              Protein Shakes/Drinks + 2 choices per day of  other full liquids shown below.    Other full liquids must be: No more than 12 grams of Carbs per serving,  No more than 3 grams of Fat per serving   Strained low-fat cream soup   Non-Fat milk   Fat-free Lactaid Milk   Sugar-free yogurt (Dannon Lite & Fit) Vitamins and Minerals (Start 1 day after surgery unless otherwise directed)   2 Chewable Multivitamin / Multimineral Supplement (i.e. Centrum for Adults)   Chewable Calcium Citrate with Vitamin D-3. Take 1500 mg each day.           (Example: 3 Chewable Calcium Plus 600 with Vitamin D-3 can be found at [facility])  Vitamin B-12, 350 - 500 micrograms (oral tablet) each day   Do not mix multivitamins containing iron with calcium supplements; take 2 hours   apart   Do not substitute Tums (calcium carbonate) for your calcium   Menstruating women and those at risk for anemia may need extra iron. Talk with your doctor to see if you need additional iron.    If you need extra iron:  Total daily Iron recommendations (including Vitamins) = 50 - 100 mg Iron/day Do not stop taking or change any vitamins or minerals until you talk to your nutritionist or surgeon. Your nutritionist and / or physician must approve all vitamin and mineral supplements. Exercise For maximum success, begin exercising as soon as your doctor recommends. Make sure your physician approves any physical activity.   Depending on fitness level, begin with a simple walking program   Walk 5-15 minutes each day, 7 days per week.    Slowly increase until you are walking 30-45 minutes per day   Consider joining our BELT program. [phone] or email belt@[employer] .edu Things to remember:    You may have sexual relations when you feel comfortable. It is VERY important for female patients to use a reliable birth control method. Fertility often increases after surgery. Do not get pregnant for at least 18 months.   It is very important to keep all follow up appointments with your surgeon,  nutritionist, primary care physician, and behavioral health practitioner. After the first year, please follow up with your bariatric surgeon at least once a year in order to maintain best weight loss results.  [facility]: [phone] [facility]: [phone]   Free counseling is available for you and your family through collaboration between [facility] and [place]. Please call [phone] and leave a message.    Consider purchasing a medical alert bracelet that says you had gastric bypass surgery.    The [facility] has a free Bariatric Surgery Support Group that meets monthly, the 3rd Thursday, 6 pm, Classroom #1, [place]. You may register online at [email or web address], but registration is not necessary. Select Classes and Support Groups, Bariatric Surgery, or Call [phone]   Do not return to work or drive until cleared by your surgeon   Use your CPAP when sleeping if applicable   Do not lift anything greater than ten pounds for at least two weeks  Talmadge Chad, RN Bariatric Nurse Coordinator

## 2011-07-30 NOTE — Progress Notes (Signed)
1 Day Post-Op  Subjective: Doing well. No major issues overnight. Walked to desk last pm. "sore". 1 episode of palpitations per pt-lasted a few seconds.  Objective: Vital signs in last 24 hours: Temp:  [97.7 F (36.5 C)-98.7 F (37.1 C)] 98 F (36.7 C) (05/21 0800) Pulse Rate:  [67-90] 89  (05/21 0600) Resp:  [14-22] 17  (05/21 0600) BP: (116-189)/(49-102) 126/49 mmHg (05/21 0600) SpO2:  [96 %-100 %] 99 % (05/21 0600) FiO2 (%):  [2 %] 2 % (05/20 1300) Weight:  [245 lb 6 oz (111.3 kg)-258 lb 2.5 oz (117.1 kg)] 258 lb 2.5 oz (117.1 kg) (05/21 0600) Last BM Date: 07/28/11  Intake/Output from previous day: 05/20 0701 - 05/21 0700 In: 5250 [I.V.:4950; IV Piggyback:300] Out: 1750 [Urine:1650; Blood:100] Intake/Output this shift:    Alert, nad cta Reg-non tachy Obese, soft, dressing c/d/i. Expected mild TTP +scds  Lab Results:   Basename 07/30/11 0336 07/29/11 1130  WBC 12.0* --  HGB 12.2 14.8  HCT 36.3 43.1  PLT 231 --   BMET No results found for this basename: NA:2,K:2,CL:2,CO2:2,GLUCOSE:2,BUN:2,CREATININE:2,CALCIUM:2 in the last 72 hours PT/INR No results found for this basename: LABPROT:2,INR:2 in the last 72 hours ABG No results found for this basename: PHART:2,PCO2:2,PO2:2,HCO3:2 in the last 72 hours  Studies/Results: No results found.  Anti-infectives: Anti-infectives    None      Assessment/Plan: s/p Procedure(s) (LRB): LAPAROSCOPIC ROUX-EN-Y GASTRIC BYPASS WITH UPPER ENDOSCOPY ()  D/c foley now Cont subcu heparin; heme ok Transfer to telemetry For UGI this am. If ok will start ice chips/water and restart home BP meds Ambulate  Mary Sella. Andrey Campanile, MD, FACS General, Bariatric, & Minimally Invasive Surgery [facility], [place]   LOS: 1 day    Atilano Ina 07/30/2011

## 2011-07-30 NOTE — Progress Notes (Signed)
Patient ID: Melanie Bray, female   DOB: 06/10/1955, 56 y.o.   MRN: [id number]  Pt looks good. Has a little bit of congestion in nose. Tolerating ice chips/sips of water. Vitals stable. hgb down slightly.  Adv to POD 2 diet Monitor hgb - cont heparin for now Flutter valve  Mary Sella. Andrey Campanile, MD, FACS General, Bariatric, & Minimally Invasive Surgery [facility], [place]

## 2011-07-30 NOTE — Progress Notes (Signed)
Called [facility] [address] to give report to transfer to #[id number], nurse in with pt will return call.

## 2011-07-31 LAB — CBC
Platelets: 208 10*3/uL (ref 150–400)
RBC: 3.5 MIL/uL — ABNORMAL LOW (ref 3.87–5.11)
WBC: 10.2 10*3/uL (ref 4.0–10.5)

## 2011-07-31 LAB — DIFFERENTIAL
Lymphocytes Relative: 32 % (ref 12–46)
Lymphs Abs: 3.2 10*3/uL (ref 0.7–4.0)
Neutro Abs: 6.1 10*3/uL (ref 1.7–7.7)
Neutrophils Relative %: 59 % (ref 43–77)

## 2011-07-31 MED ORDER — OXYCODONE-ACETAMINOPHEN 5-325 MG/5ML PO SOLN: 5.0000 mL | ORAL | Status: DC | PRN

## 2011-07-31 NOTE — Progress Notes (Signed)
Pt with pleasant disposition; alert and oriented; VSS; tolerating protein shake and water well; denies any nausea or vomiting; denies flatus or BM today; voiding without difficulty; ambulating in hallways without difficulty; c/o some abdominal soreness with relief from prn pain meds; pt already has follow up appts with [facility] and [facility]; aware of BELT program and support group; discharge instructions reviewed and questions answered and pt verbalized understanding of.  GASTRIC BYPASS/SLEEVE DISCHARGE INSTRUCTIONS  Drs. Fredrik Rigger, Hoxworth, Wilson, and Canby Call if you have any problems.   Call [phone] and ask for the surgeon on call.    If you need immediate assistance come to the ER at [facility]. Tell the ER personnel that you are a new post-op gastric bypass patient. Signs and symptoms to report:   Severe vomiting or nausea. If you cannot tolerate clear liquids for longer than 1 day, you need to call your surgeon.    Abdominal pain which does not get better after taking your pain medication   Fever greater than 101 F degree   Difficulty breathing   Chest pain    Redness, swelling, drainage, or foul odor at incision sites    If your incisions open or pull apart   Swelling or pain in calf (lower leg)   Diarrhea, frequent watery, uncontrolled bowel movements.   Constipation, (no bowel movements for 3 days) if this occurs, Take Milk of Magnesia, 2 tablespoons by mouth, 3 times a day for 2 days if needed.  Call your doctor if constipation continues. Stop taking Milk of Magnesia once you have had a bowel movement. You may also use Miralax according to the label instructions.   Anything you consider "abnormal for you".   Normal side effects after Surgery:   Unable to sleep at night or concentrate   Irritability   Being tearful (crying) or depressed   These are common complaints, possibly related to your anesthesia, stress of surgery and change in lifestyle, that usually go away a few  weeks after surgery.  If these feelings continue, call your medical doctor.  Wound Care You may have surgical glue, steri-strips, or staples over your incisions after surgery.  Surgical glue:  Looks like a clear film over your incisions and will wear off gradually. Steri-strips: Strips of tape over your incisions. You may notice a yellowish color on the skin underneath the steri-strips. This is a substance used to make the steri-strips stick better. Do not pull the steri-strips off - let them fall off.  Staples: Cherlynn Polo may be removed before you leave the hospital. If you go home with staples, call [facility] [phone]) for an appointment with your surgeon's nurse to have staples removed in 7 - 10 days. Showering: You may shower two days after your surgery unless otherwise instructed by your surgeon. Wash gently around wounds with warm soapy water, rinse well, and gently pat dry.  If you have a drain, you may need someone to hold this while you shower. Avoid tub baths until staples are removed and incisions are healed.    Medications   Medications should be liquid or crushed if larger than the size of a dime.  Extended release pills should not be crushed.   Depending on the size and number of medications you take, you may need to stagger/change the time you take your medications so that you do not over-fill your pouch.    Make sure you follow-up with your primary care physician to make medication adjustments needed during  rapid weight loss and life-style adjustment.   If you are diabetic, follow up with the doctor that prescribes your diabetes medication(s) within one week after surgery and check your blood sugar regularly.   Do not drive while taking narcotics!   Do not take acetaminophen (Tylenol) and Roxicet or Lortab Elixir at the same time since these pain medications contain acetaminophen.  Diet at home: (First 2 Weeks) You will see the nutritionist two weeks after your  surgery. She will advance your diet if you are tolerating liquids well. Once at home, if you have severe vomiting or nausea and cannot tolerate clear liquids lasting longer than 1 day, call your surgeon.  Begin high protein shake 2 ounces every 3 hours, 5 - 6 times per day.  Gradually increase the amount you drink as tolerated.  You may find it easier to slowly sip shakes throughout the day.  It is important to get your proteins in first.   Protein Shake   Drink at least 2 ounces of shake 5-6 times per day   Each serving of protein shakes should have a minimum of 15 grams of protein and no more than 5 grams of carbohydrate    Increase the amount of protein shake you drink as tolerated   Protein powder may be added to fluids such as non-fat milk or Lactaid milk (limit to 20 grams added protein powder per serving   The initial goal is to drink at least 8 ounces of protein shake/drink per day (or as directed by the nutritionist). Some examples of protein shakes are [employer], [employer]illard[employer], EAS Edge HP, and Unjury. Hydration   Gradually increase the amount of water and other liquids as tolerated (See Acceptable Fluids)   Gradually increase the amount of protein shake as tolerated     Sip fluids slowly and throughout the day   May use Sugar substitutes, use sparingly (limit to 6 - 8 packets per day). Your fluid goal is 64 ounces of fluid daily. It may take a few weeks to build up to this.         32 oz (or more) should be clear liquids and 32 oz (or more) should be full liquids.         Liquids should not contain sugar, caffeine, or carbonation! Acceptable Fluids Clear Liquids:   Water or Sugar-free flavored water, Fruit H2O   Decaffeinated coffee or tea (sugar-free)   Crystal Lite, Wyler's Lite, Minute Maid Lite   Sugar-free Jell-O   Bouillon or broth   Sugar-free Popsicle:   *Less than 20 calories each; Limit 1 per day   Full Liquids:              Protein Shakes/Drinks + 2 choices  per day of other full liquids shown below.    Other full liquids must be: No more than 12 grams of Carbs per serving,  No more than 3 grams of Fat per serving   Strained low-fat cream soup   Non-Fat milk   Fat-free Lactaid Milk   Sugar-free yogurt (Dannon Lite & Fit) Vitamins and Minerals (Start 1 day after surgery unless otherwise directed)   2 Chewable Multivitamin / Multimineral Supplement (i.e. Centrum for Adults)   Chewable Calcium Citrate with Vitamin D-3. Take 1500 mg each day.           (Example: 3 Chewable Calcium Plus 600 with Vitamin D-3 can be found at [facility])         Vitamin B-12,  350 - 500 micrograms (oral tablet) each day   Do not mix multivitamins containing iron with calcium supplements; take 2 hours   apart   Do not substitute Tums (calcium carbonate) for your calcium   Menstruating women and those at risk for anemia may need extra iron. Talk with your doctor to see if you need additional iron.    If you need extra iron:  Total daily Iron recommendations (including Vitamins) = 50 - 100 mg Iron/day Do not stop taking or change any vitamins or minerals until you talk to your nutritionist or surgeon. Your nutritionist and / or physician must approve all vitamin and mineral supplements. Exercise For maximum success, begin exercising as soon as your doctor recommends. Make sure your physician approves any physical activity.   Depending on fitness level, begin with a simple walking program   Walk 5-15 minutes each day, 7 days per week.    Slowly increase until you are walking 30-45 minutes per day   Consider joining our BELT program. [phone] or email belt@[employer] .edu Things to remember:    You may have sexual relations when you feel comfortable. It is VERY important for female patients to use a reliable birth control method. Fertility often increases after surgery. Do not get pregnant for at least 18 months.   It is very important to keep all follow up appointments with your  surgeon, nutritionist, primary care physician, and behavioral health practitioner. After the first year, please follow up with your bariatric surgeon at least once a year in order to maintain best weight loss results.  [facility]: [phone] [facility]: [phone]   Free counseling is available for you and your family through collaboration between [facility] and [facility]. Please call [phone] and leave a message.    Consider purchasing a medical alert bracelet that says you had gastric bypass surgery.    [facility] has a free Bariatric Surgery Support Group that meets monthly, the 3rd Thursday, 6 pm, Classroom #1, [place]. You may register online at [email or web address], but registration is not necessary. Select Classes and Support Groups, Bariatric Surgery, or Call [phone]   Do not return to work or drive until cleared by your surgeon   Use your CPAP when sleeping if applicable   Do not lift anything greater than ten pounds for at least two weeks   Talmadge Chad, RN Bariatric Nurse Coordinator

## 2011-07-31 NOTE — Discharge Instructions (Signed)
Gastric Bypass Surgery Care After Refer to this sheet in the next few weeks. These discharge instructions provide you with general information on caring for yourself after you leave the hospital. Your caregiver may also give you specific instructions. Your treatment has been planned according to the most current medical practices available, but unavoidable complications sometimes occur. If you have any problems or questions after discharge, call your caregiver. HOME CARE INSTRUCTIONS  Activity  Take frequent walks throughout the day. This will help to prevent blood clots. Do not sit for longer than 45 minutes to 1 hour while awake for 4 to 6 weeks after surgery.   Continue to do coughing and deep breathing exercises once you get home. This will help to prevent pneumonia.   Do not do strenuous activities, such as heavy lifting, pushing, or pulling, until after your follow-up visit with your caregiver. Do not lift anything heavier than 15 lb (4.5 kg).   Talk with your caregiver about when you may return to work and your exercise routine.   Do not drive while taking prescription pain medicine.  Nutrition  It is very important that you drink plenty of liquids per day.   You should stay on a clear liquid diet until your follow-up visit with your caregiver. Keep sugar-free, clear liquid items on hand, including:   Tea: hot or cold. Drink only decaffeinated for the first month.   Broths: clear beef, chicken, vegetable.   Others: water, sugar-free frozen ice pops, flavored water, gelatin (after 1 week).   Do not consume caffeine for 1 month. Large amounts of caffeine can cause dehydration.   A dietician may also give you specific instructions.   Follow your caregiver's recommendations about vitamins and protein requirements after surgery.  Hygiene  You may shower and wash your hair 2 days after surgery. Pat incisions dry. Do not rub incisions with a washcloth or towel.   Follow your  caregiver's recommendations about baths and pools following surgery.  Pain control  If a prescription medicine was given, follow your caregiver's directions.   You may feel some gas pain caused by the carbon dioxide used to inflate your abdomen during surgery. This pain can be felt in your chest, shoulder, back, or abdominal area. Moving around often is advised.  Incision care  You may have 6 or more small incisions. They are closed with skin adhesive strips. Skin adhesive strips can get wet and will fall off on their own. Check your incisions and surrounding area daily for any redness, swelling, discoloration, fluid (drainage), or bleeding. Dark red, dried blood may appear under these coverings. This is normal.  SEEK MEDICAL CARE IF:   You develop persistent nausea and vomiting.   You have pain and discomfort with swallowing.   You have pain, swelling, or warmth in the lower extremities.   You have an oral temperature above 101 F (38.9 C).   You develop chills.   Your incision sites look red, swollen, or have drainage.   Your stool is black, tarry, or maroon in color.   You are lightheaded when standing.   You notice a bruise getting larger.   You have any questions or concerns.  SEEK IMMEDIATE MEDICAL CARE IF:   You have chest pain.   You have severe calf pain or pain not relieved by medicine.   You develop shortness of breath or difficulty breathing.   There is bright red blood coming from the drain.   You feel confused.  You have slurred speech.   You suddenly feel weak.  MAKE SURE YOU:   Understand these instructions.   Will watch your condition.   Will get help right away if you are not doing well or get worse.    [facility] [address] [place], [place] [address] [phone]  Dr Gaynelle Adu

## 2011-07-31 NOTE — Discharge Summary (Signed)
Physician Discharge Summary  Patient ID: Melanie Bray MRN: [id number] DOB/AGE: [birth year] 56 y.o.  Admit date: 07/29/2011 Discharge date: 07/31/2011  Admission Diagnoses: Patient Active Problem List  Diagnoses  . Obesity, Class III, BMI 40-49.9 (morbid obesity)  . Single cyst of left breast  . Hypertension  . Hypercholesterolemia  . GERD (gastroesophageal reflux disease)    Discharge Diagnoses:  Active Problems:  Obesity, Class III, BMI 40-49.9 (morbid obesity)  Hypertension  Hypercholesterolemia  GERD (gastroesophageal reflux disease)   Discharged Condition: good  Hospital Course: The patient underwent LAPAROSCOPIC ROUX-EN-Y GASTRIC BYPASS AND EGD on May 20. It should be noted that the patient did lose about 20 pounds during her preop diet phase. Postoperatively, the patient was placed in step-down overnight for cardiac monitoring.  On POD 1, she underwent an UGI which showed no evidence of leak and typical post bypass anatomy. She was transferred to the telemetry unit. She was started on her POD 1 diet. Her vitals remained stable. She had no evidence of arrhythmias during her hospitalization. She was maintained on perioperative subcutaneous heparin. On the evening of POD 1, she was advanced to POD 2 diet. On POD 2, she was tolerating her protein shakes, ambulating well, and stable vitals.   Consults: None  Significant Diagnostic Studies: labs: hgb 10.5 and radiology: UGI - see above  Treatments: IV hydration, analgesia: acetaminophen, morphine, and roxicet, and surgery: LAPAROSCOPIC ROUX-EN-Y GASTRIC BYPASS AND EGD  Discharge Exam: Blood pressure 126/71, pulse 82, temperature 98 F (36.7 C), temperature source Oral, resp. rate 18, height 5\' 6"  (1.676 m), weight 234 lb (106.142 kg), SpO2 98.00%. Alert, NAD CTA Reg Obese, soft, mild expected TTP, incision c/d/i; camera port has some bruising +scds  Disposition: 01-Home or Self Care  Discharge Orders    Future  Appointments: Provider: Department: Dept Phone: Center:   08/13/2011 4:00 PM [facility] Post-Op Class [facility] [phone] [facility]   08/14/2011 10:15 AM Atilano Ina, MD,FACS [facility] [phone] None     Future Orders Please Complete By Expires   Increase activity slowly      Discharge instructions      Comments:   See Bariatric discharge instructions     Medication List  As of 07/31/2011  8:11 AM   TAKE these medications         cetirizine 10 MG tablet   Commonly known as: ZYRTEC   Take 10 mg by mouth every morning.      metoprolol 100 MG tablet   Commonly known as: LOPRESSOR   Take 100 mg by mouth 2 (two) times daily.      MULTIVITAMIN PO   Take by mouth.      oxyCODONE-acetaminophen 5-325 MG/5ML solution   Commonly known as: ROXICET   Take 5-10 mLs by mouth every 4 (four) hours as needed for pain.      simvastatin 80 MG tablet   Commonly known as: ZOCOR   Take 80 mg by mouth every morning.      TAMBOCOR 100 MG tablet   Generic drug: flecainide   Take 100 mg by mouth 2 (two) times daily.           Follow-up Information    Follow up with Atilano Ina, MD,FACS on 08/14/2011. (10:15am; pls arrive 10 min prior to appt)    Contact information:   [facility] [address], [address] [place] [address] [phone]         Mary Sella.  Andrey Campanile, MD, FACS General, Bariatric, & Minimally Invasive Surgery [facility], [place]   Signed: Atilano Ina 07/31/2011, 8:11 AM

## 2011-08-07 ENCOUNTER — Other Ambulatory Visit (INDEPENDENT_AMBULATORY_CARE_PROVIDER_SITE_OTHER): Payer: Self-pay | Admitting: General Surgery

## 2011-08-07 ENCOUNTER — Ambulatory Visit (INDEPENDENT_AMBULATORY_CARE_PROVIDER_SITE_OTHER): Payer: 59 | Admitting: Surgery

## 2011-08-07 ENCOUNTER — Ambulatory Visit (HOSPITAL_COMMUNITY): Admission: RE | Admit: 2011-08-07 | Payer: 59 | Source: Ambulatory Visit

## 2011-08-07 ENCOUNTER — Other Ambulatory Visit (INDEPENDENT_AMBULATORY_CARE_PROVIDER_SITE_OTHER): Payer: Self-pay | Admitting: Surgery

## 2011-08-07 ENCOUNTER — Ambulatory Visit (HOSPITAL_COMMUNITY)
Admission: RE | Admit: 2011-08-07 | Discharge: 2011-08-07 | Disposition: A | Discharge status: Home / Self Care | Payer: 59 | Source: Ambulatory Visit | Attending: Surgery | Admitting: Surgery

## 2011-08-07 VITALS — BP 132/78 | HR 92 | Temp 97.8°F | Resp 20 | Ht 66.0 in | Wt 238.0 lb

## 2011-08-07 DIAGNOSIS — R1012 Left upper quadrant pain: Secondary | ICD-10-CM

## 2011-08-07 DIAGNOSIS — R071 Chest pain on breathing: Secondary | ICD-10-CM

## 2011-08-07 DIAGNOSIS — R0781 Pleurodynia: Secondary | ICD-10-CM

## 2011-08-07 DIAGNOSIS — R109 Unspecified abdominal pain: Secondary | ICD-10-CM | POA: Insufficient documentation

## 2011-08-07 DIAGNOSIS — M51379 Other intervertebral disc degeneration, lumbosacral region without mention of lumbar back pain or lower extremity pain: Secondary | ICD-10-CM | POA: Insufficient documentation

## 2011-08-07 DIAGNOSIS — I700 Atherosclerosis of aorta: Secondary | ICD-10-CM | POA: Insufficient documentation

## 2011-08-07 DIAGNOSIS — Z9884 Bariatric surgery status: Secondary | ICD-10-CM | POA: Insufficient documentation

## 2011-08-07 DIAGNOSIS — M5137 Other intervertebral disc degeneration, lumbosacral region: Secondary | ICD-10-CM | POA: Insufficient documentation

## 2011-08-07 MED ORDER — IOHEXOL 300 MG/ML  SOLN
100.0000 mL | Freq: Once | INTRAMUSCULAR | Status: AC | PRN
  Administered 2011-08-07: 100 mL via INTRAVENOUS

## 2011-08-07 NOTE — Progress Notes (Signed)
Mr. and Mrs. Tegtmeyer came down from [place] because of left upper chest/abdomen pleural pain.  This occurred last night and was rather sudden in onset. She denies any nausea or vomiting.  On physical exam her pulse rate is 90 and she's got normal blood pressure. She looks recently comfortable and doesn't appear to have any abdominal distress. The pain is pleuritic and localized and a one and make sure she hasn't had a pulmonary embolism. We will go in and schedule a CTA and asked him to give her some oral contrast to look at her pouch.  2 rule out left peripheral pulmonary embolism

## 2011-08-08 ENCOUNTER — Telehealth (INDEPENDENT_AMBULATORY_CARE_PROVIDER_SITE_OTHER): Payer: Self-pay | Admitting: General Surgery

## 2011-08-08 NOTE — Telephone Encounter (Signed)
Message copied by Liliana Cline on Thu Aug 08, 2011  5:26 PM ------      Message from: Andrey Campanile, ERIC M      Created: Thu Aug 08, 2011  4:38 PM       Hi,            Pls call pt and check on her if u have time today. Let her know I'm aware of her visit with [facility] yesterday and ct scan last night. I'll call her Friday            Clent Ridges

## 2011-08-08 NOTE — Telephone Encounter (Signed)
Spoke with patient. She is still having the same left sided pain. Worse when she drinks. She is getting liquids down. No nausea or vomiting. She is not taking pain medicine because she doesn't like the way it makes her feel. She is moving her bowels and passing pass. States she isn't belching. She is taking gas x and using a heating pad. Aware to keep doing these things and we will call to check on her in the morning. Patient to call if needed.

## 2011-08-13 ENCOUNTER — Encounter: Payer: Self-pay | Admitting: *Deleted

## 2011-08-13 ENCOUNTER — Encounter: Payer: 59 | Attending: General Surgery | Admitting: *Deleted

## 2011-08-13 VITALS — Ht 66.0 in | Wt 235.0 lb

## 2011-08-13 DIAGNOSIS — Z01818 Encounter for other preprocedural examination: Secondary | ICD-10-CM | POA: Insufficient documentation

## 2011-08-13 DIAGNOSIS — Z713 Dietary counseling and surveillance: Secondary | ICD-10-CM | POA: Insufficient documentation

## 2011-08-13 NOTE — Progress Notes (Signed)
  Bariatric Class:  Appt start time: 1600 end time:  1700.  2 Week Post-Operative Nutrition Class  Patient was seen on 08/13/2011 for Post-[facility]ative [facility]   Surgery date: 07/29/11  Surgery type: RYGB  Last weight @ [facility]: 265.4 lbs (03/26/11)  Weight today: 235.0 lbs Weight change: 30.4 lbs Total weight lost: 30.4 lbs BMI: 37.9 kg/m^2  TANITA  BODY COMP RESULTS  08/13/11   %Fat 50.1%   Fat Mass (lbs) 117.5   Fat Free Mass (lbs) 117.5   Total Body Water (lbs) 86.0   The following the learning objective met the patient during this course:   Identifies Phase 3A (Soft, High Proteins) Dietary Goals and will begin from 2 weeks post-operatively to 2 months post-operatively  Identifies appropriate sources of fluids and proteins   States protein recommendations and appropriate sources post-operatively  Identifies the need for appropriate texture modifications, mastication, and bite sizes when consuming solids  Identifies appropriate multivitamin and calcium sources post-operatively  Describes the need for physical activity post-operatively and will follow MD recommendations  States when to call healthcare provider regarding medication questions or post-operative complications  Handouts given during class include:  Phase 3A: Soft, High Protein Diet Handout  Samples dispensed during class include:   Celebrate Vitamins  MVI (Pineapple-Strawberry): 1 ea - Lot # [id number]; Exp: 09/14  Iron + C (30 mg): 1 ea - Lot # [id number];  Exp: 07/14   Follow-Up Plan: Patient will follow-up at [facility] in 6 weeks for 2 months post-op nutrition visit for diet advancement per MD.

## 2011-08-13 NOTE — Patient Instructions (Signed)
Patient to follow Phase 3A-Soft, High Protein Diet and follow-up at [facility] in 6 weeks for 2 months post-op nutrition visit for diet advancement. 

## 2011-08-14 ENCOUNTER — Encounter (INDEPENDENT_AMBULATORY_CARE_PROVIDER_SITE_OTHER): Payer: Self-pay | Admitting: General Surgery

## 2011-08-14 ENCOUNTER — Ambulatory Visit (INDEPENDENT_AMBULATORY_CARE_PROVIDER_SITE_OTHER): Payer: 59 | Admitting: General Surgery

## 2011-08-14 VITALS — BP 102/78 | HR 68 | Temp 97.4°F | Resp 14 | Ht 66.0 in | Wt 234.0 lb

## 2011-08-14 DIAGNOSIS — Z09 Encounter for follow-up examination after completed treatment for conditions other than malignant neoplasm: Secondary | ICD-10-CM

## 2011-08-14 NOTE — Progress Notes (Signed)
Chief complaint: Postop Procedure: Status post laparoscopic Roux-en-Y gastric bypass, EGD May 20th  History of Present Ilness: [name] comes in today for her first followup visit. She was discharged from the hospital on the 22nd. She was seen in our urgent office last Wednesday for left upper quadrant pain with oral intake. It was felt to be more pleuritic in nature. However she was sent for a CT scan of her chest abdomen and pelvis. There was no evidence of pulmonary embolism or intra-abdominal problems. There is no evidence of leak. Since that time the pain has gotten better. She was advanced to solid proteins yesterday. Interestingly, she has no pain with solid food intake. She still has some discomfort with liquid intake. It has gotten better since she has changed how she she drinks liquids. She denies any fevers, chills, nausea, vomiting, diarrhea, melena, hematochezia, leg pain, shortness of breath. She is having a bowel movement about every other day. She is walking everyday.  Physical Exam: BP 102/78  Pulse 68  Temp(Src) 97.4 F (36.3 C) (Temporal)  Resp 14  Ht 5\' 6"  (1.676 m)  Wt 234 lb (106.142 kg)  BMI 37.77 kg/m2  Gen: alert, NAD, non-toxic appearing Pupils: equal, no scleral icterus Pulm: Lungs clear to auscultation, symmetric chest rise CV: regular rate and rhythm Abd: soft, nontender, nondistended. Well-healed trocar sites. No cellulitis. No incisional hernia. Large amount of bruising in lower abdominal wall (green-blue) Ext: no edema, no calf tenderness Skin: no rash, no jaundice  Assessment and Plan: This [name] status post laparoscopic Roux-en-Y gastric bypass.  Overall she is doing well. I think her left upper quadrant pain is more a result of her swallowing to much air since she has no pain with solid food intake. I encouraged her to continue her daily exercising. She is released to full activities. She is encouraged to keep  up with her protein intake. Follow up 2-3 weeks  Mary Sella. Andrey Campanile, MD, FACS General, Bariatric, & Minimally Invasive Surgery [facility], [place]

## 2011-08-14 NOTE — Patient Instructions (Signed)
Keep exercising!

## 2011-09-06 ENCOUNTER — Ambulatory Visit (INDEPENDENT_AMBULATORY_CARE_PROVIDER_SITE_OTHER): Payer: 59 | Admitting: General Surgery

## 2011-09-06 ENCOUNTER — Encounter (INDEPENDENT_AMBULATORY_CARE_PROVIDER_SITE_OTHER): Payer: Self-pay | Admitting: General Surgery

## 2011-09-06 VITALS — BP 130/84 | HR 64 | Temp 97.4°F | Resp 16 | Ht 66.0 in | Wt 224.0 lb

## 2011-09-06 DIAGNOSIS — Z9884 Bariatric surgery status: Secondary | ICD-10-CM

## 2011-09-06 DIAGNOSIS — Z09 Encounter for follow-up examination after completed treatment for conditions other than malignant neoplasm: Secondary | ICD-10-CM

## 2011-09-09 NOTE — Progress Notes (Signed)
Subjective:     Patient ID: Melanie Bray, female   DOB: [birth year], 56 y.o.   MRN: [id number]  HPI [name] status post laparoscopic Roux-en-Y gastric bypass on May 20 comes in for her second postoperative appointment. She states that overall she is doing well. She denies any dumping. She has had a little bit of heartburn. She did have one episode yesterday where a piece of food felt as if it got stuck. She is still having some very mild intermittent discomfort with liquids mainly in her left upper quadrant and flank. If she takes very small sips of liquids it doesn't cause her any problems. She denies any fevers or chills. She denies any severe diarrhea. She has been taking her supplements. She generally feels restriction now with oral intake. She has seen her cardiologist who has ordered a 3 day Holter monitor for later in the month. The goal of which is to maybe decrease some of her dosages of her heart medication. She is having daily bowel movements. She is a little bit concerned that she is not getting enough liquid intake. She states that she had some lab work done at her cardiologist's office.  Review of Systems As above    Objective:   Physical Exam BP 130/84  Pulse 64  Temp 97.4 F (36.3 C) (Temporal)  Resp 16  Ht 5\' 6"  (1.676 m)  Wt 224 lb (101.606 kg)  BMI 36.15 kg/m2  Gen: alert, NAD, non-toxic appearing Pupils: equal, no scleral icterus Pulm: Lungs clear to auscultation, symmetric chest rise CV: regular rate and rhythm Abd: soft, nontender, nondistended. Well-healed trocar sites. No cellulitis. No incisional hernia Ext: no edema, no calf tenderness Skin: no rash, no jaundice     Assessment:     56 yo WF s/p Laparoscopic Roux-en-y gastric bypass    Plan:     Her weight loss to date is 39 pounds. Her weight loss since her last appointment on 08/14/11 has been 10 pounds. I congratulated her on her weight loss. I encouraged her to continue with  daily  exercise. I advised her she could take a small sip of liquid between bites of food. I did recommend that she wait a minute to a minute and a half between taking bites of food. She does not appear to be dehydrated on physical exam. We will obtain the copies of the labs performed by her referring provider. If these did not include an electrolyte panel we will order a set of electrolytes. She was encouraged to continue taking your daily supplements. Followup 3 months  Mary Sella. Andrey Campanile, MD, FACS General, Bariatric, & Minimally Invasive Surgery [facility] Surgery, [place]

## 2011-09-24 ENCOUNTER — Encounter: Payer: 59 | Attending: General Surgery | Admitting: *Deleted

## 2011-09-24 ENCOUNTER — Encounter: Payer: Self-pay | Admitting: *Deleted

## 2011-09-24 VITALS — Ht 66.0 in | Wt 220.5 lb

## 2011-09-24 DIAGNOSIS — Z01818 Encounter for other preprocedural examination: Secondary | ICD-10-CM | POA: Insufficient documentation

## 2011-09-24 DIAGNOSIS — Z713 Dietary counseling and surveillance: Secondary | ICD-10-CM | POA: Insufficient documentation

## 2011-09-24 NOTE — Patient Instructions (Addendum)
Goals:  Follow Phase 3B: High Protein + Non-Starchy Vegetables  Eat 3-6 small meals/snacks, every 3-5 hrs  Increase lean protein foods to meet 60-80g goal  Increase fluid intake to 64oz +  Avoid drinking 15 minutes before, during and 30 minutes after eating  Aim for >30 min of physical activity daily 

## 2011-09-24 NOTE — Progress Notes (Signed)
Follow-up visit:  8 Weeks Post-Operative RYGB Surgery  Medical Nutrition Therapy:  Appt start time: 1500 end time:  1545.  Primary concerns today: Post-operative Bariatric Surgery Nutrition Management.  Surgery date: 07/29/11  Surgery type: RYGB  Last weight @ [facility]: 265.4 lbs (03/26/11)  Weight today: 220.5 lbs Weight change: 14.5 lbs Total weight lost: 44.9 lbs BMI: 35.6 kg/m^2  Goal weight: 160 lbs % goal met: 43%  TANITA  BODY COMP RESULTS  08/13/11 09/24/11   %Fat 50.1%  45.0%   Fat Mass (lbs) 117.5 99.0   Fat Free Mass (lbs) 117.5 121.5   Total Body Water (lbs) 86.0 89.0   24-hr recall:  B (AM): [place] sausage (2 oz) Snk (AM): cheese stick (2) - 10g   L (PM): 2 oz tuna or chicken w/ LF mayo Snk (PM): cheese stick - 5g  D (PM): Blue Crab (3 oz) Snk (PM): None or SF popsicle or pudding  Fluid intake: ~ 60 oz Estimated total protein intake: ~ 70-80  Medications: No changes Supplementation: Taking regularly  Using straws: No Drinking while eating: No Hair loss: No Carbonated beverages: No N/V/D/C: Diarrhea with dairy now Dumping syndrome: None  Recent physical activity:  Walks daily and swims whenever not raining. Also parks in back of parking lot at work - 10 min walk to office.   Progress Towards Goal(s):  In progress.  Handouts given during class include:  Phase 3B: High Protein + Non-Starchy Vegetables   Nutritional Diagnosis:  [place]-3.3 Overweight/obesity As related to recent RYGB surgery as evidenced by patient following post-op nutrition guidelines for continued weight loss.    Intervention:  Nutrition education.  Monitoring/Evaluation:  Dietary intake, exercise, and body weight. Follow up in 1 month for 3 month post-op visit.

## 2011-10-21 ENCOUNTER — Encounter (INDEPENDENT_AMBULATORY_CARE_PROVIDER_SITE_OTHER): Payer: Self-pay

## 2011-10-26 ENCOUNTER — Encounter: Payer: Self-pay | Admitting: *Deleted

## 2011-10-26 ENCOUNTER — Encounter: Payer: 59 | Attending: General Surgery | Admitting: *Deleted

## 2011-10-26 VITALS — Ht 66.0 in | Wt 210.5 lb

## 2011-10-26 DIAGNOSIS — Z01818 Encounter for other preprocedural examination: Secondary | ICD-10-CM | POA: Insufficient documentation

## 2011-10-26 DIAGNOSIS — E66813 Obesity, class 3: Secondary | ICD-10-CM

## 2011-10-26 DIAGNOSIS — Z713 Dietary counseling and surveillance: Secondary | ICD-10-CM | POA: Insufficient documentation

## 2011-10-26 NOTE — Progress Notes (Signed)
Follow-up visit:  12 Weeks Post-Operative RYGB Surgery  Medical Nutrition Therapy:  Appt start time: 1245 end time:  1315.  Primary concerns today: Post-operative Bariatric Surgery Nutrition Management. Melanie Bray returns today with no problems reported other than some "weakness" in her legs since increasing exercise. Following guidelines to the letter and doing very well.  Total weight loss of ~55 lbs at this point. Has f/u with Dr. Andrey Campanile on 8/27 for labs.   Surgery date: 07/29/11  Surgery type: RYGB  Last weight @ [facility]: 265.4 lbs (03/26/11)  Weight today: 210.5 lbs Weight change: 10.0 lbs Total weight lost: 54.9 lbs BMI: 33.9 kg/m^2  Goal weight: 160 lbs % goal met: 52%  TANITA  BODY COMP RESULTS  08/13/11 09/24/11 10/26/11   %Fat 50.1%  45.0% 45.4%   Fat Mass (lbs) 117.5 99.0 95.5   Fat Free Mass (lbs) 117.5 121.5 114.5   Total Body Water (lbs) 86.0 89.0 84.0   24-hr recall: B (AM): [place] sausage (2 oz) Snk (AM): cheese stick (2) - 10g  L (PM): Meatball sub w/ no bread OR grilled chicken salad Snk (PM): cheese stick - 5g  D (PM): [employer] - mozzarella cheese dip OR pork roast (2 oz) and 1 oz green beans Snk (PM): None or SF pudding  Fluid intake: ~ 80 oz  Estimated total protein intake: ~ 70-80 g  Medications: No changes Supplementation: Taking regularly  Using straws: No Drinking while eating: No Hair loss: No Carbonated beverages: No N/V/D/C: None Dumping syndrome: None  Recent physical activity:  Walks daily and "runs" for 60 min in pool.  Also parks in back of parking lot at work - 10 min walk to office.   Progress Towards Goal(s):  In progress.  Nutritional Diagnosis:  [place]-3.3 Overweight/obesity As related to recent RYGB surgery as evidenced by patient following post-op nutrition guidelines for continued weight loss.    Intervention:  Nutrition education/reinforcement.  Monitoring/Evaluation:  Dietary intake, exercise, and body weight. Follow up in 3 months  for 6 month post-op visit.

## 2011-10-26 NOTE — Patient Instructions (Signed)
Goals:  Follow Phase 3B: High Protein + Non-Starchy Vegetables  Eat 3-6 small meals/snacks, every 3-5 hrs  Increase lean protein foods to meet 60-80g goal  Increase fluid intake to 64oz +  Add 15 grams of carbohydrate (fruit, whole grain, starchy vegetable) with meals  Avoid drinking 15 minutes before, during and 30 minutes after eating  Aim for >30 min of physical activity daily  

## 2011-12-06 ENCOUNTER — Ambulatory Visit (INDEPENDENT_AMBULATORY_CARE_PROVIDER_SITE_OTHER): Payer: 59 | Admitting: General Surgery

## 2011-12-06 ENCOUNTER — Encounter: Payer: Self-pay | Admitting: *Deleted

## 2011-12-06 ENCOUNTER — Encounter (INDEPENDENT_AMBULATORY_CARE_PROVIDER_SITE_OTHER): Payer: Self-pay | Admitting: General Surgery

## 2011-12-06 VITALS — BP 108/64 | HR 76 | Temp 97.6°F | Resp 14 | Ht 66.0 in | Wt 200.1 lb

## 2011-12-06 DIAGNOSIS — E669 Obesity, unspecified: Secondary | ICD-10-CM | POA: Insufficient documentation

## 2011-12-06 DIAGNOSIS — Z9884 Bariatric surgery status: Secondary | ICD-10-CM

## 2011-12-06 NOTE — Patient Instructions (Signed)
Keep up the great work!

## 2011-12-06 NOTE — Progress Notes (Signed)
Subjective:     Patient ID: Melanie Bray, female   DOB: 1955/05/27, 56 y.o.   MRN: [id number]  HPI [name] comes in for followup after undergoing a laparoscopic Roux-en-Y gastric bypass on 07/29/2011. I last saw her in the office on June 28. Since that time she states that she has been doing great. She has followed up with her cardiologist and he decreased her dosage of Lopressor by half. She states that she is walking about 1-1/4 miles every day. She denies any abdominal pain, nausea, dumping, diarrhea, constipation, hair thinning. She reports that she is compliant with her supplements. She did report one episode of vomiting this morning after taken an antibiotic for recently diagnosed sinusitis infection. She denies any new significant medical diagnoses. She denies any reflux.  PMHx, PSHx, SOCHx, FAMHx, ALL reviewed   Review of Systems 10 point ROS performed and all systems negative except for HPI.     Objective:   Physical Exam BP 108/64  Pulse 76  Temp 97.6 F (36.4 C) (Temporal)  Resp 14  Ht 5\' 6"  (1.676 m)  Wt 200 lb 2 oz (90.776 kg)  BMI 32.30 kg/m2  Preop Wt: 262 lbs Last office wt 6/28: 224 lbs  Gen: alert, NAD, non-toxic appearing Pupils: equal, no scleral icterus Pulm: Lungs clear to auscultation, symmetric chest rise CV: regular rate and rhythm Abd: soft, nontender, nondistended. Well-healed trocar sites. No cellulitis. No incisional hernia Ext: no edema, no calf tenderness Skin: no rash, no jaundice     Assessment:     S/p LRYGB Hypertension - Improved, decrease dose by 1/2 GERD - resolved H/o cardiac arrhythmia - stable H/o Dyslipidemia - Preop labs wnl    Plan:     She is doing great!. Total weight loss 61.8 pounds! I encouraged her to continue with daily exercise and continue to take her daily supplements. We will check labs at her next visit. F/u 3 months  Mary Sella. Andrey Campanile, MD, FACS General, Bariatric, & Minimally Invasive  Surgery [facility] Surgery, [place]

## 2011-12-10 ENCOUNTER — Encounter: Payer: Self-pay | Admitting: *Deleted

## 2011-12-10 NOTE — Progress Notes (Signed)
Sra stopped by for repeat Tanita scan at 4 mos s/p RYGB while in town to see Dr. Andrey Campanile.   TANITA BODY COMP RESULTS   08/13/11  09/24/11  10/26/11  12/06/11  %Fat  50.1%  45.0%  45.4%  41.9%  Fat Mass (lbs)  117.5  99.0  95.5  84.0  Fat Free Mass (lbs)  117.5  121.5  114.5  116.5  Total Body Water (lbs)  86.0  89.0  84.0  85.5

## 2012-01-14 ENCOUNTER — Encounter: Payer: 59 | Attending: General Surgery | Admitting: *Deleted

## 2012-01-14 ENCOUNTER — Encounter: Payer: Self-pay | Admitting: *Deleted

## 2012-01-14 VITALS — Ht 66.0 in | Wt 188.0 lb

## 2012-01-14 DIAGNOSIS — Z01818 Encounter for other preprocedural examination: Secondary | ICD-10-CM | POA: Insufficient documentation

## 2012-01-14 DIAGNOSIS — E669 Obesity, unspecified: Secondary | ICD-10-CM

## 2012-01-14 DIAGNOSIS — Z713 Dietary counseling and surveillance: Secondary | ICD-10-CM | POA: Insufficient documentation

## 2012-01-14 NOTE — Progress Notes (Signed)
Follow-up visit:  6 Months Post-Operative RYGB Surgery  Medical Nutrition Therapy:  Appt start time: 1230   End time:  1300.  Primary concerns today: Post-operative Bariatric Surgery Nutrition Management. Melanie Bray returns today for f/u with loss of 22.5 lbs since last visit. Doing well, but reports she lost her job so under extreme stress. Very emotional at visit. Discussed staying on track if stress causes decreased appetite, etc.   Surgery date: 07/29/11  Surgery type: RYGB  Last weight @ [facility]: 265.4 lbs (03/26/11)  Weight today: 188.0 lbs Weight change: 22.5 lbs Total weight lost: 77.4 lbs BMI: 30.3 kg/m^2  Goal weight: 160 lbs % goal met: 73%  TANITA BODY COMP RESULTS   08/13/11  09/24/11  10/26/11  12/06/11 01/14/12  %Fat  50.1%  45.0%  45.4%  41.9% 42.5%  Fat Mass (lbs)  117.5  99.0  95.5  84.0 80.0  Fat Free Mass (lbs)  117.5  121.5  114.5  116.5 108.0  Total Body Water (lbs)  86.0  89.0  84.0  85.5 79.0   24-hr recall: B (AM): Premier protein shake w/ 1 scoop unflavored pro powder OR 2 pcs [place] sausage and cheese omlette Snk (AM): cheese stick (2) - 10g  L (PM):  grilled chicken salad Snk (PM): apple w/ chocolate PB2  D (PM): Lean protein (2-3 oz) and 1 oz green beans Snk (PM): None  Fluid intake: ~ 80 oz  Estimated total protein intake: ~ 80-90 g  Medications: No changes Supplementation: Taking regularly  Using straws: No Drinking while eating: No Hair loss: No Carbonated beverages: No N/V/D/C: None Dumping syndrome: None  Recent physical activity:  Walks 2 miles daily.   Progress Towards Goal(s):  In progress.  Nutritional Diagnosis:  -3.3 Overweight/obesity As related to recent RYGB surgery as evidenced by patient following post-op nutrition guidelines for continued weight loss.    Intervention:  Nutrition education/reinforcement.  Monitoring/Evaluation:  Dietary intake, exercise, and body weight. Follow up in 3 months for 6 month post-op visit.

## 2012-01-14 NOTE — Patient Instructions (Addendum)
Goals:  Follow Phase 3B: High Protein + Non-Starchy Vegetables  Eat 3-6 small meals/snacks, every 3-5 hrs  Increase lean protein foods to meet 60-80g goal  Increase fluid intake to 64oz +  Add 15 grams of carbohydrate (fruit, whole grain, starchy vegetable) with meals  Avoid drinking 15 minutes before, during and 30 minutes after eating  Aim for >30 min of physical activity daily  

## 2012-03-12 ENCOUNTER — Ambulatory Visit (INDEPENDENT_AMBULATORY_CARE_PROVIDER_SITE_OTHER): Payer: 59 | Admitting: General Surgery

## 2012-06-14 IMAGING — CT CT ABD-PELV W/ CM
3 of 11 series · 13 of 46 positions shown, 19 images · IV contrast (APPLIED)
Comparison: {Gastrograffin upper GI 07/30/2011.}

CT CHEST

CLINICAL DATA: [Sudden onset of severe left-sided pain; query PE
versus anastomotic leak status post gastric bypass.]

CT CHEST, ABDOMEN AND PELVIS WITHOUT CONTRAST
TECHNIQUE: Multidetector CT imaging of the chest, abdomen and
pelvis was performed following the standard protocol without IV
contrast.

[Series 5: rtn ap with st · axial · 0.74mm/px · z∈[-695,-300]mm · 6 of 111 slices shown, 11 images]
[im 16/111  soft-tissue]
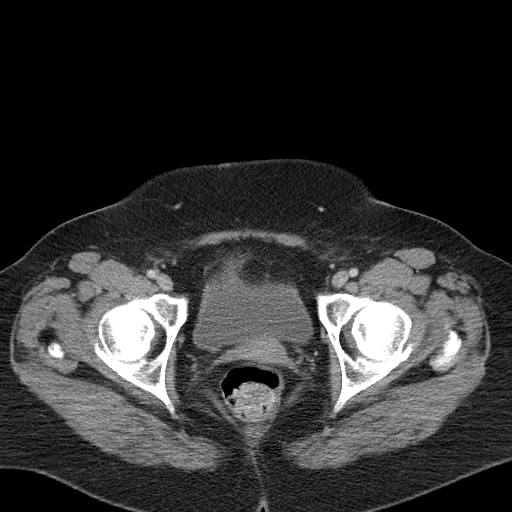
[im 16/111  bone]
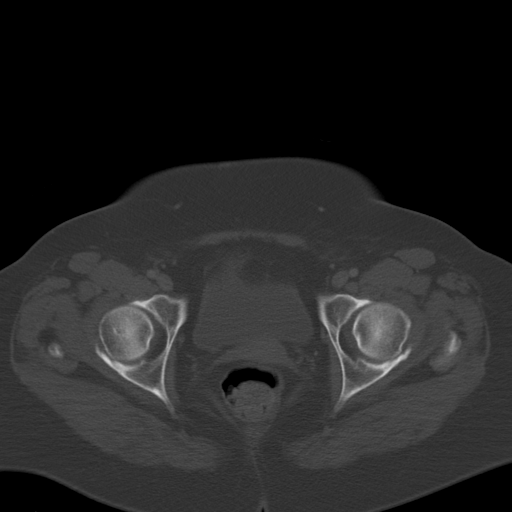
[im 32/111  soft-tissue]
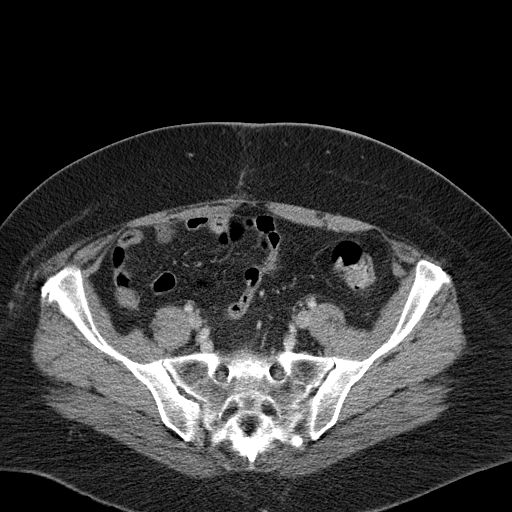
[im 48/111  soft-tissue]
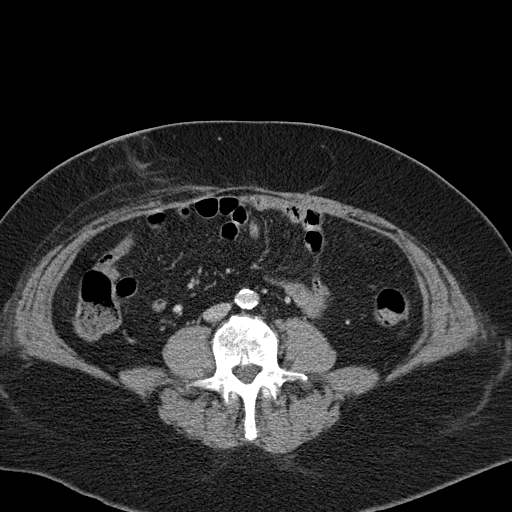
[im 48/111  lung]
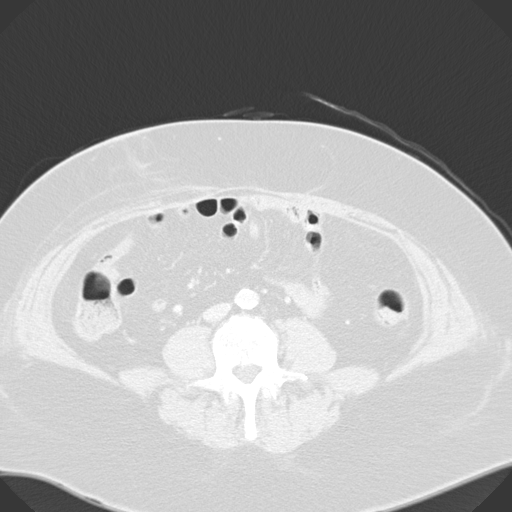
[im 63/111  soft-tissue]
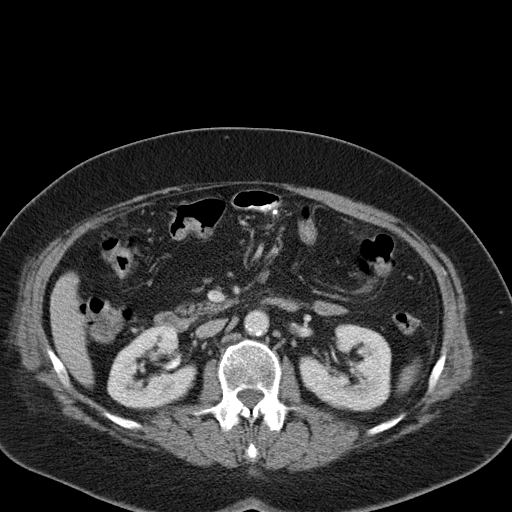
[im 63/111  lung]
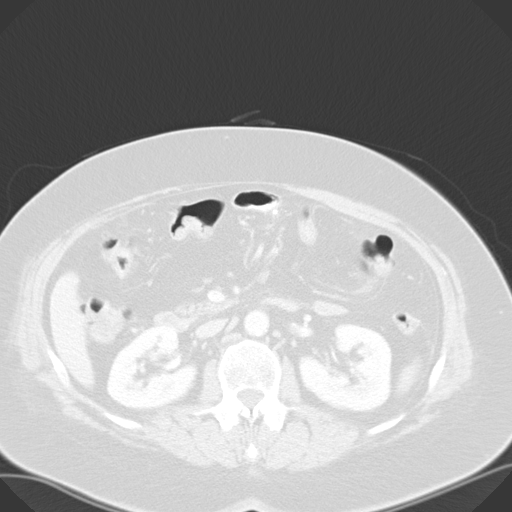
[im 79/111  soft-tissue]
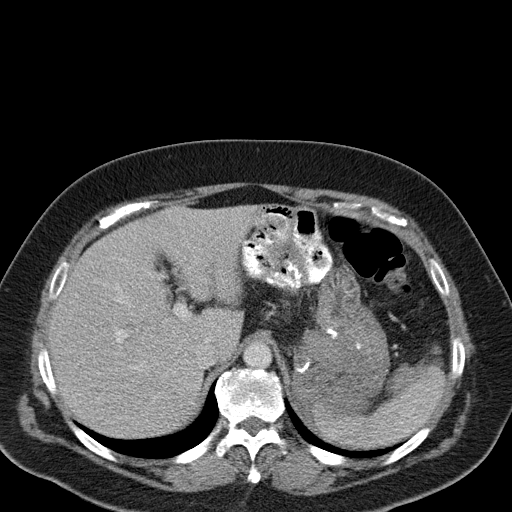
[im 79/111  lung]
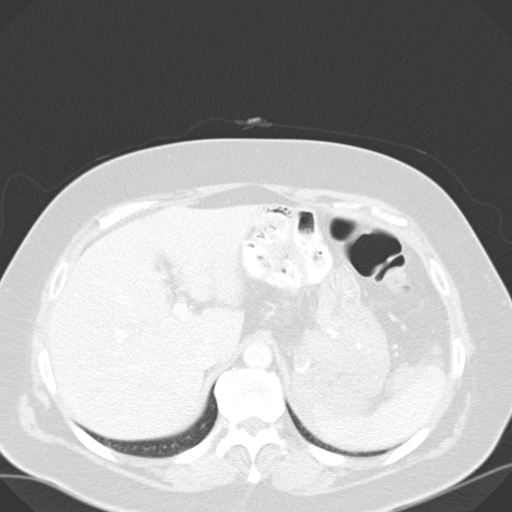
[im 95/111  soft-tissue]
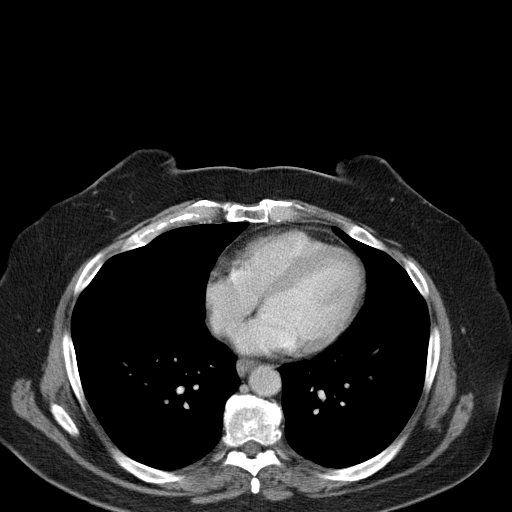
[im 95/111  lung]
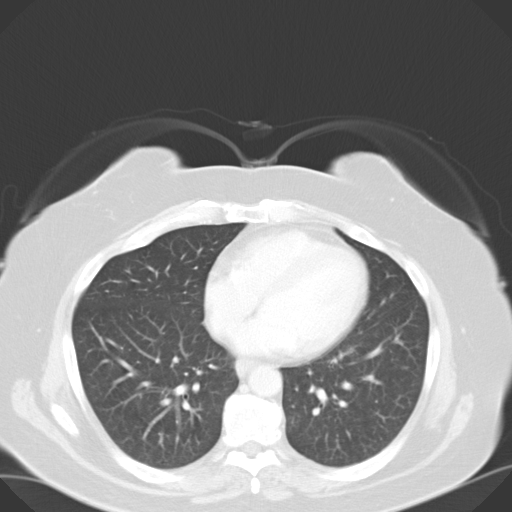

[Series 6: pe thins @ 1mm · axial · 0.70mm/px · z∈[-316,-226]mm · 5 of 197 slices shown]
[im 16/197  soft-tissue]
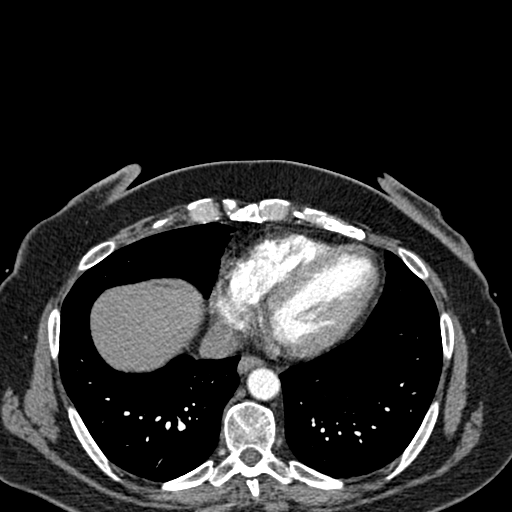
[im 46/197  soft-tissue]
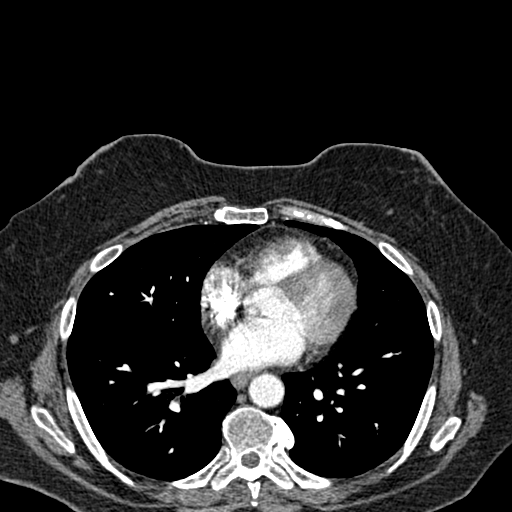
[im 61/197  soft-tissue]
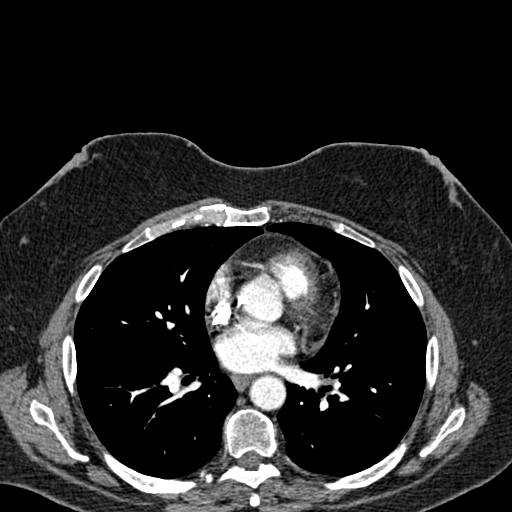
[im 91/197  soft-tissue]
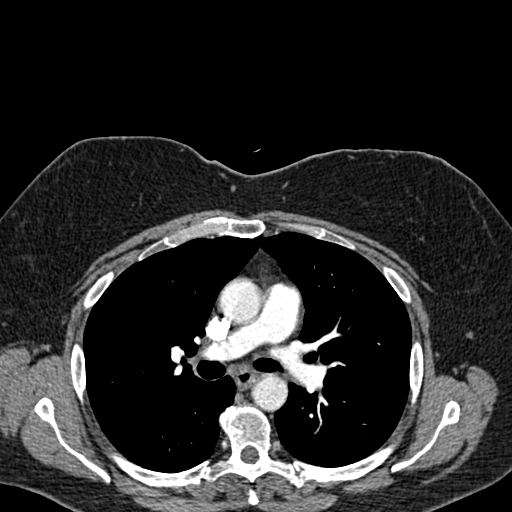
[im 106/197  soft-tissue]
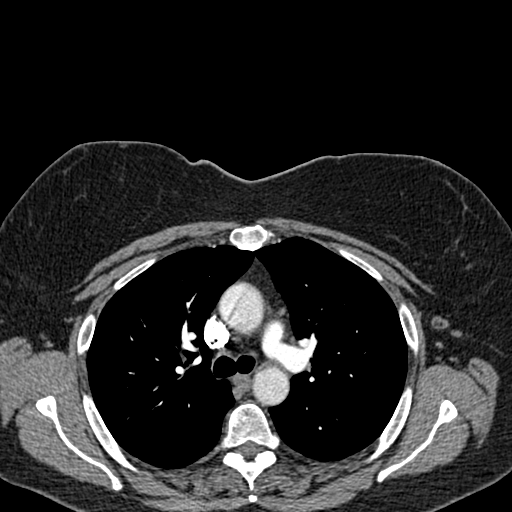

[Series 607: cor · coronal · 1.12mm/px · 2 of 78 slices shown, 3 images]
[im 26/78  soft-tissue]
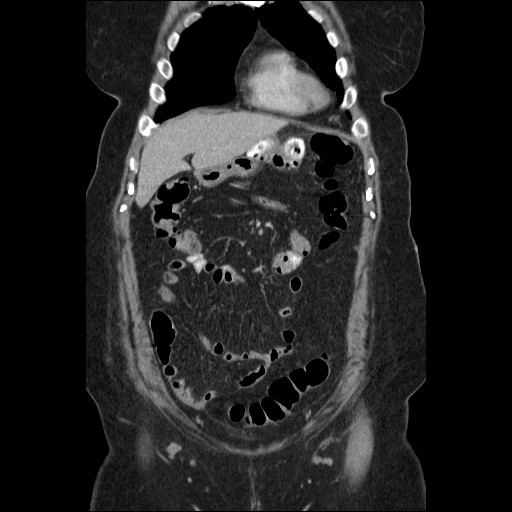
[im 26/78  bone]
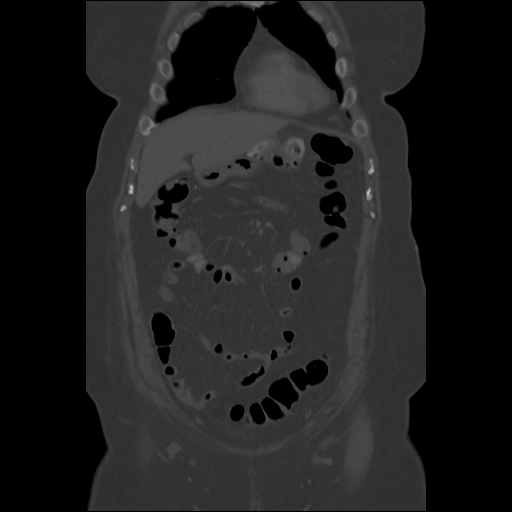
[im 52/78  soft-tissue]
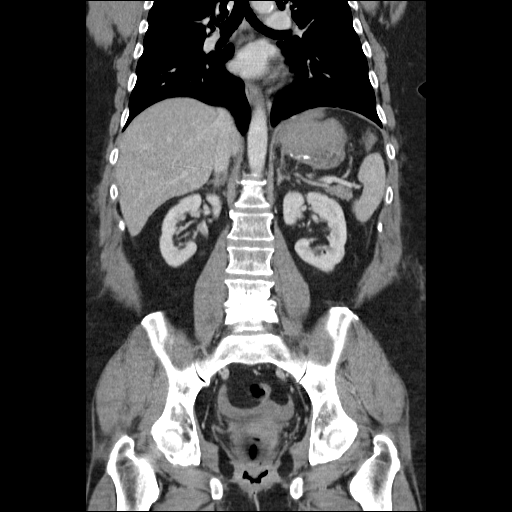

[13 of 46 positions shown; findings below may reference images not displayed]

FINDINGS: [There is good opacification of the pulmonary
vasculature and there is no evidence for pulmonary emboli.  Clear
lung fields with normal heart size.  No pleural effusion or
pneumothorax.  No adenopathy. No osseous findings.]
IMPRESSION: [No evidence for pulmonary embolic disease or postoperative
pneumonia/pleural effusion.]

CT ABDOMEN AND PELVIS
FINDINGS: [Following the initial abdomen pelvis, the patient was
asked to return for additional more dense oral contrast and
rescanning to evaluate left upper quadrant fluid.

The patient is status post Roux-en-Y diversion for bariatric
surgery.  There is no evidence for leak of contrast.  Slight fluid
medial to the spleen in the left upper quadrant felt to be
postoperative.  No free air.

Subcutaneous stranding of the fat status post placement of
instruments for gastric surgery.  No evidence for loculated fluid
collection.

Normal appearing liver, spleen, pancreas, and adrenal glands.
Cholecystectomy.  5 mm nonobstructive right renal calculus.  No
left renal calculi. No renal masses or hydronephrosis.
Nonaneurysmal atherosclerotic calcification of the aorta.  No
adenopathy.  Unremarkable stomach and small bowel.  No appendiceal
inflammation.  No pelvic masses or free fluid.  Severe degenerative
disc disease L4-L5 without osseous destructive lesion.]
IMPRESSION: [Status post Roux-en-Y version for bariatric surgery.  No evidence
for contrast leak.  Slight fluid medial to the spleen left upper
quadrant, felt to be postoperative in nature.]

## 2012-06-17 ENCOUNTER — Ambulatory Visit (INDEPENDENT_AMBULATORY_CARE_PROVIDER_SITE_OTHER): Payer: 59 | Admitting: General Surgery

## 2012-06-18 ENCOUNTER — Ambulatory Visit (INDEPENDENT_AMBULATORY_CARE_PROVIDER_SITE_OTHER): Payer: 59 | Admitting: General Surgery

## 2012-06-19 ENCOUNTER — Encounter (INDEPENDENT_AMBULATORY_CARE_PROVIDER_SITE_OTHER): Payer: Self-pay | Admitting: General Surgery

## 2012-06-19 ENCOUNTER — Ambulatory Visit (INDEPENDENT_AMBULATORY_CARE_PROVIDER_SITE_OTHER): Payer: BC Managed Care – PPO | Admitting: General Surgery

## 2012-06-19 VITALS — BP 120/70 | HR 84 | Resp 18 | Ht 66.0 in | Wt 164.0 lb

## 2012-06-19 DIAGNOSIS — Z9884 Bariatric surgery status: Secondary | ICD-10-CM

## 2012-06-19 NOTE — Progress Notes (Signed)
Subjective:     Patient ID: Melanie Bray, female   DOB: 09/27/1955, 57 y.o.   MRN: [id number]  HPI 57 year old Caucasian female comes in for her first year followup after undergoing laparoscopic Roux-en-Y gastric bypass. I last saw her in the office on 12/06/2011. She underwent surgery 07/29/2011. She states that she has been doing well since her last visit. She has had changed jobs. She now works second shift from 4-12. Now she gets about 8 hours of sleep a night. She states that she doesn't have any issues with sweet foods however she does have problems with fatty greasy foods. Within about 30 minutes after eating something that's mildly greasy she will have explosive diarrhea. She denies any paresthesias. She denies any nausea, vomiting, regurgitation. Her blood pressure has improved. Her cardiologist has her Lopressor reduced to 50 mg once a day as opposed to 100 mg twice a day. She states she is taking her multivitamin and calcium. Right now she is not getting much exercise. She walks a lot at work. She states that she is more energetic. She denies any weakness or syncope.  PMHx, PSHx, SOCHx, FAMHx, ALL reviewed   Review of Systems 10 point review of systems is performed and all systems are negative except what is mentioned in the history of present illness    Objective:   Physical Exam BP 120/70  Pulse 84  Resp 18  Ht 5\' 6"  (1.676 m)  Wt 164 lb (74.39 kg)  BMI 26.48 kg/m2  Gen: alert, NAD, non-toxic appearing Pupils: equal, no scleral icterus Pulm: Lungs clear to auscultation, symmetric chest rise CV: regular rate and rhythm Abd: soft, nontender, nondistended. Well-healed trocar sites. No cellulitis. No incisional hernia Ext: no edema, no calf tenderness Skin: no rash, no jaundice     Assessment:     Status post laparoscopic Roux-en-Y gastric bypass 07/29/2011 Hypertension-improved Gastroesophageal reflux disease-resolved History of dyslipidemia-preoperative labs within  normal limits     Plan:     Overall she is doing well. Total weight loss is approximately 99 pounds. I congratulated her on her weight loss. Her weight at her last visit was 200 pounds. We discussed ways to increase her exercise. I encouraged her to use her pedometer and track her daily steps and set weekly goals of increasing her step count. She also has a swimming pool and is going to start swimming.  She was encouraged to continue to be compliant with her supplements. She is due for lab surveillance. We discussed the importance of good food choices. As long as her labs are normal followup in 1 year  Mary Sella. Andrey Campanile, MD, FACS General, Bariatric, & Minimally Invasive Surgery [facility], [place]

## 2012-06-19 NOTE — Patient Instructions (Signed)
Track your steps and set weekly goals like we discussed Get your blood work done Keep up the great work!

## 2013-03-18 ENCOUNTER — Other Ambulatory Visit (INDEPENDENT_AMBULATORY_CARE_PROVIDER_SITE_OTHER): Payer: Self-pay | Admitting: *Deleted

## 2013-03-18 ENCOUNTER — Telehealth (INDEPENDENT_AMBULATORY_CARE_PROVIDER_SITE_OTHER): Payer: Self-pay | Admitting: *Deleted

## 2013-03-18 ENCOUNTER — Other Ambulatory Visit (INDEPENDENT_AMBULATORY_CARE_PROVIDER_SITE_OTHER): Payer: Self-pay | Admitting: General Surgery

## 2013-03-18 DIAGNOSIS — Z9884 Bariatric surgery status: Secondary | ICD-10-CM

## 2013-03-18 NOTE — Telephone Encounter (Signed)
Pt called in asking if her labs orders could be renewed since they were ordered in October and she never went to have them done so they got cancelled out of the system.  I spoke with Dr. Andrey CampanileWilson and he stated that it was fine to re-order the CBC w/ diff.  I called pt back and notified her that [facility] and [facility] during [facility] to have her labs drawn.  She is agreeable at this time.

## 2013-03-19 LAB — CBC WITH DIFFERENTIAL/PLATELET
BASOS ABS: 0 10*3/uL (ref 0.0–0.1)
BASOS PCT: 1 % (ref 0–1)
EOS ABS: 0.2 10*3/uL (ref 0.0–0.7)
Eosinophils Relative: 5 % (ref 0–5)
HCT: 41.7 % (ref 36.0–46.0)
Hemoglobin: 14.2 g/dL (ref 12.0–15.0)
Lymphocytes Relative: 35 % (ref 12–46)
Lymphs Abs: 1.9 10*3/uL (ref 0.7–4.0)
MCH: 31.1 pg (ref 26.0–34.0)
MCHC: 34.1 g/dL (ref 30.0–36.0)
MCV: 91.4 fL (ref 78.0–100.0)
Monocytes Absolute: 0.6 10*3/uL (ref 0.1–1.0)
Monocytes Relative: 11 % (ref 3–12)
NEUTROS PCT: 48 % (ref 43–77)
Neutro Abs: 2.6 10*3/uL (ref 1.7–7.7)
PLATELETS: 226 10*3/uL (ref 150–400)
RBC: 4.56 MIL/uL (ref 3.87–5.11)
RDW: 13.1 % (ref 11.5–15.5)
WBC: 5.4 10*3/uL (ref 4.0–10.5)

## 2013-03-22 ENCOUNTER — Telehealth (INDEPENDENT_AMBULATORY_CARE_PROVIDER_SITE_OTHER): Payer: Self-pay

## 2013-03-22 NOTE — Telephone Encounter (Signed)
LMOM giving pt good news cbc is normal no anemia per DR Andrey CampanileWilson.

## 2013-05-13 ENCOUNTER — Encounter (HOSPITAL_COMMUNITY): Payer: Self-pay | Admitting: Emergency Medicine

## 2013-05-13 ENCOUNTER — Emergency Department (HOSPITAL_COMMUNITY)
Admission: EM | Admit: 2013-05-13 | Discharge: 2013-05-13 | Disposition: A | Discharge status: Home / Self Care | Payer: BC Managed Care – PPO | Source: Home / Self Care | Attending: Family Medicine | Admitting: Family Medicine

## 2013-05-13 DIAGNOSIS — L309 Dermatitis, unspecified: Secondary | ICD-10-CM

## 2013-05-13 DIAGNOSIS — L259 Unspecified contact dermatitis, unspecified cause: Secondary | ICD-10-CM

## 2013-05-13 MED ORDER — TRIAMCINOLONE ACETONIDE 0.1 % EX CREA: 1.0000 "application " | TOPICAL_CREAM | Freq: Two times a day (BID) | CUTANEOUS | Status: DC

## 2013-05-13 MED ORDER — PREDNISONE 5 MG PO KIT: PACK | ORAL | Status: DC

## 2013-05-13 NOTE — Discharge Instructions (Signed)
Thank you for coming in today. Take prednisone daily for 12 days. Use triamcinolone cream on the rash. Use Gold Bond Itch as needed for temporary control of itching. Call or go to the emergency room if you get worse, have trouble breathing, have chest pains, or palpitations.

## 2013-05-13 NOTE — ED Notes (Signed)
C/o rash on arms and legs.   Possible allergic reaction.  Recent change to dove body wash.  No other changes or new medication.  No relief with benadryl.  On set yesterday.

## 2013-05-13 NOTE — ED Provider Notes (Signed)
Melanie Bray is a 58 y.o. female who presents to Urgent Care today for rash present for 2 days. Patient notes that she started using a new bath soap recently. She denies visual breathing fevers chills nausea vomiting or diarrhea. She's tried using Benadryl which seemed to help a little. No nausea vomiting or diarrhea.   Past Medical History  Diagnosis Date  . Hypertension   . Hypercholesteremia   . Depression   . GERD (gastroesophageal reflux disease)   . Arthritis   . Arrhythmia   . Asthma     seasonal   History  Substance Use Topics  . Smoking status: Former Smoker -- 0.75 packs/day for 30 years    Types: Cigarettes    Quit date: 05/10/2011  . Smokeless tobacco: Never Used  . Alcohol Use: No   ROS as above Medications: No current facility-administered medications for this encounter.   Current Outpatient Prescriptions  Medication Sig Dispense Refill  . cetirizine (ZYRTEC) 10 MG tablet Take 10 mg by mouth daily.      . flecainide (TAMBOCOR) 100 MG tablet Take 100 mg by mouth 2 (two) times daily.       . metoprolol (LOPRESSOR) 100 MG tablet Take 100 mg by mouth 2 (two) times daily.       . Multiple Vitamins-Minerals (MULTIVITAMIN PO) Take by mouth.      Marland Kitchen CALCIUM PO Take by mouth 3 (three) times daily.      . PredniSONE 5 MG KIT 12 day dosepack po  1 kit  0  . triamcinolone cream (KENALOG) 0.1 % Apply 1 application topically 2 (two) times daily.  30 g  0    Exam:  BP 130/61  Pulse 66  Temp(Src) 98.6 F (37 C) (Oral)  Resp 12  SpO2 100% Gen: Well NAD HEENT: EOMI,  MMM Lungs: Normal work of breathing. CTABL Heart: RRR no MRG Abd: NABS, Soft. NT, ND Exts: Brisk capillary refill, warm and well perfused.  Skin: Maculopapular rash across extremities   Assessment and Plan: 58 y.o. female with dermatitis. Plan to treat with low-dose prednisone Dosepak as well as triamcinolone cream. Continue Benadryl as needed. Followup with primary care provider  Discussed warning  signs or symptoms. Please see discharge instructions. Patient expresses understanding.    Gregor Hams, MD 05/13/13 [phone]

## 2013-07-14 ENCOUNTER — Ambulatory Visit (INDEPENDENT_AMBULATORY_CARE_PROVIDER_SITE_OTHER): Payer: BC Managed Care – PPO | Admitting: General Surgery

## 2013-07-14 ENCOUNTER — Encounter (INDEPENDENT_AMBULATORY_CARE_PROVIDER_SITE_OTHER): Payer: Self-pay | Admitting: General Surgery

## 2013-07-14 VITALS — BP 100/60 | HR 60 | Temp 97.0°F | Resp 14 | Ht 66.0 in | Wt 156.2 lb

## 2013-07-14 DIAGNOSIS — Z9884 Bariatric surgery status: Secondary | ICD-10-CM

## 2013-07-14 DIAGNOSIS — E663 Overweight: Secondary | ICD-10-CM | POA: Insufficient documentation

## 2013-07-14 DIAGNOSIS — Z1239 Encounter for other screening for malignant neoplasm of breast: Secondary | ICD-10-CM

## 2013-07-14 MED ORDER — DOXYCYCLINE HYCLATE 100 MG PO TABS: 100.0000 mg | ORAL_TABLET | Freq: Two times a day (BID) | ORAL | Status: AC

## 2013-07-14 NOTE — Progress Notes (Signed)
Subjective:     Patient ID: Melanie Bray, female   DOB: 06/03/1955, 58 y.o.   MRN: [id number]  HPI 58 year old Caucasian female comes in for long-term followup after undergoing laparoscopic Roux-en-Y gastric bypass surgery in May 2013. Her preoperative weight was 263 pounds. Her visit on 06/19/2012 her weight was 164 pounds. She states that she is doing really well. She states that she is taking her supplements as directed. She denies any abdominal pain. She denies any dumping syndrome. She denies any diarrhea. She denies any melena or hematochezia. She does have some flatulence. She reports increased energy. She denies any regurgitation. She has recently started to have some mild heartburn about a week ago. She reports a good appetite. She is drinking about 80-100 ounces of water a day. She did have 3 kidney stones recently. She also complains of a bump underneath her and left buttock cheek that is irritating  PMHx, PSHx, SOCHx, FAMHx, ALL reviewed and unchanged' Review of Systems A 12  point Review of systems was performed and all systems are negative except for what is mentioned in the history of present illness     Objective:   Physical Exam BP 100/60  Pulse 60  Temp(Src) 97 F (36.1 C)  Resp 14  Ht 5\' 6"  (1.676 m)  Wt 156 lb 3.2 oz (70.852 kg)  BMI 25.22 kg/m2  Gen: alert, NAD, non-toxic appearing Pupils: equal, no scleral icterus Pulm: Lungs clear to auscultation, symmetric chest rise CV: regular rate and rhythm Abd: soft, nontender, nondistended. Well-healed trocar sites. No cellulitis. No incisional hernia Ext: no edema, no calf tenderness Skin: no rash, no jaundice; L buttock fold - small 1.5cm area of cellulitis with small bump. No fluctuance. No signif induration     Assessment:     Status post laparoscopic Roux-en-Y gastric bypass History of arrhythmia Hypertension Arthritis Left gluteal cleft skin lesion     Plan:     Overall I think she is doing great. She is 2  years out and has maintained her weight loss. Total weight loss has been around 106 pounds. I congratulated her on her weight loss.  It appears she is taking her supplements as rectum. I did confirm that she is taking calcium citrate and not other forms of calcium supplementation which should have no bearing on kidney stone formation. It appears she is drinking adequate amounts of liquid  We discussed the importance of routine exercise in order to help prevent weight regain.  With respect to the skin lesion appears like a small bump. It is not significant enough at this point where it needs to be lanced. I did give her prescription for doxycycline for a week. I instructed her to contact me if the area worsens.  She is overdue for a followup mammogram. We will schedule her for a mammogram. She is also due for routine lab surveillance we will order those labs.  Assuming there are no issues I will see her in one year  Mary Sellaric M. Andrey CampanileWilson, MD, FACS General, Bariatric, & Minimally Invasive Surgery [facility], [place]

## 2013-07-14 NOTE — Patient Instructions (Signed)
Please get labs drawn - you need to be fasting Please get mammogram done Keep up the good work!

## 2013-07-15 ENCOUNTER — Other Ambulatory Visit (INDEPENDENT_AMBULATORY_CARE_PROVIDER_SITE_OTHER): Payer: Self-pay | Admitting: General Surgery

## 2013-07-15 ENCOUNTER — Telehealth (INDEPENDENT_AMBULATORY_CARE_PROVIDER_SITE_OTHER): Payer: Self-pay | Admitting: *Deleted

## 2013-07-15 DIAGNOSIS — N63 Unspecified lump in unspecified breast: Secondary | ICD-10-CM

## 2013-07-15 NOTE — Telephone Encounter (Signed)
LM for pt to return the call.  Please advise pt of her appt for Mammogran @ [facility], 07/26/13, arrive at 2:15.  Thanks!  Victorino DikeJennifer

## 2013-07-26 ENCOUNTER — Other Ambulatory Visit: Payer: BC Managed Care – PPO

## 2016-02-27 ENCOUNTER — Encounter (HOSPITAL_COMMUNITY): Payer: Self-pay

## 2018-02-27 ENCOUNTER — Encounter (HOSPITAL_COMMUNITY): Payer: Self-pay
